# Patient Record
Sex: Female | Born: 1935 | Race: White | Hispanic: No
Health system: Southern US, Community
[De-identification: ages and names within clinical notes are randomized; demographics above are authoritative.]

## PROBLEM LIST (undated history)

## (undated) DIAGNOSIS — I10 Essential (primary) hypertension: Secondary | ICD-10-CM

---

## 2020-03-14 ENCOUNTER — Emergency Department (HOSPITAL_COMMUNITY): Payer: Medicare Other

## 2020-03-14 ENCOUNTER — Encounter (HOSPITAL_COMMUNITY): Payer: Self-pay

## 2020-03-14 ENCOUNTER — Other Ambulatory Visit: Payer: Self-pay

## 2020-03-14 ENCOUNTER — Emergency Department (HOSPITAL_COMMUNITY)
Admission: EM | Admit: 2020-03-14 | Discharge: 2020-03-14 | Disposition: A | Discharge status: Home / Self Care | Payer: Medicare Other | Attending: Emergency Medicine | Admitting: Emergency Medicine

## 2020-03-14 DIAGNOSIS — I10 Essential (primary) hypertension: Secondary | ICD-10-CM | POA: Diagnosis not present

## 2020-03-14 DIAGNOSIS — Y9311 Activity, swimming: Secondary | ICD-10-CM | POA: Diagnosis not present

## 2020-03-14 DIAGNOSIS — Y998 Other external cause status: Secondary | ICD-10-CM | POA: Diagnosis not present

## 2020-03-14 DIAGNOSIS — S0990XA Unspecified injury of head, initial encounter: Secondary | ICD-10-CM | POA: Diagnosis present

## 2020-03-14 DIAGNOSIS — S50812A Abrasion of left forearm, initial encounter: Secondary | ICD-10-CM | POA: Diagnosis not present

## 2020-03-14 DIAGNOSIS — M25531 Pain in right wrist: Secondary | ICD-10-CM | POA: Diagnosis not present

## 2020-03-14 DIAGNOSIS — S0181XA Laceration without foreign body of other part of head, initial encounter: Secondary | ICD-10-CM | POA: Diagnosis not present

## 2020-03-14 DIAGNOSIS — E010 Iodine-deficiency related diffuse (endemic) goiter: Secondary | ICD-10-CM

## 2020-03-14 DIAGNOSIS — S80212A Abrasion, left knee, initial encounter: Secondary | ICD-10-CM | POA: Insufficient documentation

## 2020-03-14 DIAGNOSIS — M25561 Pain in right knee: Secondary | ICD-10-CM | POA: Diagnosis not present

## 2020-03-14 DIAGNOSIS — M25532 Pain in left wrist: Secondary | ICD-10-CM | POA: Diagnosis not present

## 2020-03-14 DIAGNOSIS — W19XXXA Unspecified fall, initial encounter: Secondary | ICD-10-CM | POA: Diagnosis not present

## 2020-03-14 DIAGNOSIS — T07XXXA Unspecified multiple injuries, initial encounter: Secondary | ICD-10-CM

## 2020-03-14 DIAGNOSIS — Y929 Unspecified place or not applicable: Secondary | ICD-10-CM | POA: Diagnosis not present

## 2020-03-14 HISTORY — DX: Essential (primary) hypertension: I10

## 2020-03-14 MED ORDER — LIDOCAINE-EPINEPHRINE (PF) 2 %-1:200000 IJ SOLN
10.0000 mL | Freq: Once | INTRAMUSCULAR | Status: AC
  Administered 2020-03-14: 10 mL
  Filled 2020-03-14: qty 20

## 2020-03-14 MED ORDER — TETANUS-DIPHTH-ACELL PERTUSSIS 5-2.5-18.5 LF-MCG/0.5 IM SUSP: 0.5000 mL | Freq: Once | INTRAMUSCULAR | Status: DC

## 2020-03-14 NOTE — ED Triage Notes (Signed)
Pt reports her show caught on something and she tripped and fell. Pt now has hematoma to forehead. Pt denies blood thinners. Pt also endorses R shoulder, L wrist,  And R knee pain.

## 2020-03-14 NOTE — ED Provider Notes (Signed)
Ana Walsh Provider Note   CSN: [id number] Arrival date & time: 03/14/20  1142     History Chief Complaint  Patient presents with  . Fall    Ana Walsh is a 86 y.o. female.  HPI She presents for evaluation of injuries from fall.  She was walking in a unfamiliar area, around a swimming home when she tripped on a "paver."  She is able to get up and walk afterwards.  She presents complaining of pain in her forehead, bilateral wrist, right knee.  There was no loss of consciousness.  There has been no change in behavior since the fall.  She denies headache or neck pain.  She is visiting [place], from [place], [place].  She has known thyroid disease, and has had biopsies previously.  Tetanus booster last given in August 2020.  There are no other known modifying factors.    Past Medical History:  Diagnosis Date  . Hypertension     There are no problems to display for this patient.   History reviewed. No pertinent surgical history.   OB History   No obstetric history on file.     History reviewed. No pertinent family history.  Social History   Tobacco Use  . Smoking status: Not on file  Substance Use Topics  . Alcohol use: Not on file  . Drug use: Not on file    Home Medications Prior to Admission medications   Medication Sig Start Date End Date Taking? Authorizing Provider  amLODipine (NORVASC) 2.5 MG tablet Take 2.5 mg by mouth daily. 03/04/20   [provider]  diclofenac (VOLTAREN) 75 MG EC tablet Take 75 mg by mouth at bedtime. 01/09/20   [provider]  venlafaxine XR (EFFEXOR-XR) 37.5 MG 24 hr capsule Take 37.5 mg by mouth daily. 01/09/20   [provider]    Allergies    Dicyclomine, Tamoxifen, Codeine, Tramadol, Cephalexin, and Gabapentin  Review of Systems   Review of Systems  All other systems reviewed and are negative.   Physical Exam Updated Vital Signs BP (!) 148/75 (BP  Location: Right Arm)   Pulse 63   Temp 98 F (36.7 C) (Oral)   Resp 18   SpO2 97%   Physical Exam Vitals and nursing note reviewed.  Constitutional:      General: She is not in acute distress.    Appearance: She is well-developed. She is not ill-appearing.  HENT:     Head: Normocephalic.     Comments: Contusion mid forehead with laceration, moderate bleeding with inspection.  No associated crepitation.  Face is nontender to palpation.    Right Ear: External ear normal.     Left Ear: External ear normal.     Nose: No congestion or rhinorrhea.     Comments: Small contusion over bridge of nose.  No nasal deformity or septum defect.    Mouth/Throat:     Mouth: Mucous membranes are moist.     Pharynx: No oropharyngeal exudate or posterior oropharyngeal erythema.     Comments: No trismus Eyes:     Conjunctiva/sclera: Conjunctivae normal.     Pupils: Pupils are equal, round, and reactive to light.  Neck:     Trachea: Phonation normal.  Cardiovascular:     Rate and Rhythm: Normal rate and regular rhythm.     Heart sounds: Normal heart sounds.  Pulmonary:     Effort: Pulmonary effort is normal.     Breath sounds: Normal breath sounds.  Abdominal:     General: There is no distension.     Palpations: Abdomen is soft.  Musculoskeletal:        General: Normal range of motion.     Cervical back: Normal range of motion and neck supple.  Skin:    General: Skin is warm and dry.     Comments: Scattered minor abrasions, left knee, left forehead, left arm  Neurological:     Mental Status: She is alert and oriented to person, place, and time.     Cranial Nerves: No cranial nerve deficit.     Sensory: No sensory deficit.     Motor: No abnormal muscle tone.     Coordination: Coordination normal.  Psychiatric:        Mood and Affect: Mood normal.        Behavior: Behavior normal.        Thought Content: Thought content normal.        Judgment: Judgment normal.     ED Results /  Procedures / Treatments   Labs (all labs ordered are listed, but only abnormal results are displayed) Labs Reviewed - No data to display  EKG None  Radiology DG Shoulder Right  Result Date: 03/14/2020 CLINICAL DATA:  Right shoulder pain after fall. EXAM: RIGHT SHOULDER - 2+ VIEW COMPARISON:  None. FINDINGS: Exam demonstrates severe degenerative changes of the glenohumeral joint with mild degenerate change of the [facility] joint. No evidence of acute fracture or dislocation. Degenerative change of the spine. IMPRESSION: 1. No acute findings. 2. 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 Electronically Signed   By: Elberta Fortis M.D.   On: 03/14/2020 13:27   DG Wrist Complete Left  Result Date: 03/14/2020 CLINICAL DATA:  Left wrist pain post fall. EXAM: LEFT WRIST - COMPLETE 3+ VIEW COMPARISON:  None. FINDINGS: Degenerative change of the radiocarpal joint and radial side of the carpal bones including the first carpometacarpal joint. Diffuse decreased bone mineralization. Lateral film demonstrates suggestion of a minimally displaced fracture of the distal radial metaphysis. IMPRESSION: Suggestion of a minimally displaced distal radial metaphyseal fracture. Electronically Signed   By: Elberta Fortis M.D.   On: 03/14/2020 13:30   DG Knee 2 Views Right  Result Date: 03/14/2020 CLINICAL DATA:  Right knee pain post fall. EXAM: RIGHT KNEE - 1-2 VIEW COMPARISON:  None. FINDINGS: Diffuse decreased bone mineralization. Mild to moderate osteoarthritic change involving the medial compartment and patellofemoral joints. Small joint effusion. No evidence of acute fracture or dislocation. IMPRESSION: 1. No acute fracture. 2. Osteoarthritic change and small joint effusion. Electronically Signed   By: Elberta Fortis M.D.   On: 03/14/2020 13:30   CT Head Wo Contrast  Result Date: 03/14/2020 CLINICAL DATA:  Status post fall. Hit her forehead. Large hematoma in the forehead. EXAM: CT HEAD WITHOUT CONTRAST CT  CERVICAL SPINE WITHOUT CONTRAST TECHNIQUE: Multidetector CT imaging of the head and cervical spine was performed following the standard protocol without intravenous contrast. Multiplanar CT image reconstructions of the cervical spine were also generated. COMPARISON:  None. FINDINGS: CT HEAD FINDINGS Brain: No evidence of acute infarction, hemorrhage, extra-axial collection, ventriculomegaly, or mass effect. Generalized cerebral atrophy. Periventricular white matter low attenuation likely secondary to microangiopathy. Vascular: Cerebrovascular atherosclerotic calcifications are noted. Skull: Negative for fracture or focal lesion. Sinuses/Orbits: Visualized portions of the orbits are unremarkable. Chronic mucosal thickening of the right maxillary sinus. Visualized portions of the mastoid air cells are unremarkable. Other: Left frontal scalp hematoma. CT CERVICAL SPINE FINDINGS  Alignment: Normal. Skull base and vertebrae: No acute fracture. No primary bone lesion or focal pathologic process. Generalized osteopenia. Soft tissues and spinal canal: No prevertebral fluid or swelling. No visible canal hematoma. Disc levels: Degenerative disease with disc height loss at C3-4, C4-5 and C6-7. Anterior cervical disc fusion at C5-6. Bilateral uncovertebral degenerative changes at C3-4, C4-5, C5-6 and C6-7 with foraminal encroachment. Bilateral facet arthropathy of the cervical spine. Ankylosis of the posterior elements at C6-7. Upper chest: Lung apices are clear. Other: Severely enlarged thyroid gland partially visualized with coarse calcifications in the inferior right thyroid gland and left thyroid lobe. Subtle 12 mm left thyroid nodule. Leftward deviation of the trachea. Bilateral carotid artery atherosclerosis. IMPRESSION: 1. No acute intracranial pathology. 2. No acute osseous injury of the cervical spine. 3. Severely enlarged thyroid gland partially visualized with coarse calcifications in the inferior right thyroid gland  and left thyroid lobe. Subtle 12 mm left thyroid nodule. Recommend further evaluation with a thyroid ultrasound. If patient is clinically hyperthyroid, consider nuclear medicine thyroid uptake and scan. Recommend thyroid ultrasound (ref: J Am Coll Radiol. 2015 Feb;12(2): 143-50). Electronically Signed   By: Elige Ko   On: 03/14/2020 13:22   CT Cervical Spine Wo Contrast  Result Date: 03/14/2020 CLINICAL DATA:  Status post fall. Hit her forehead. Large hematoma in the forehead. EXAM: CT HEAD WITHOUT CONTRAST CT CERVICAL SPINE WITHOUT CONTRAST TECHNIQUE: Multidetector CT imaging of the head and cervical spine was performed following the standard protocol without intravenous contrast. Multiplanar CT image reconstructions of the cervical spine were also generated. COMPARISON:  None. FINDINGS: CT HEAD FINDINGS Brain: No evidence of acute infarction, hemorrhage, extra-axial collection, ventriculomegaly, or mass effect. Generalized cerebral atrophy. Periventricular white matter low attenuation likely secondary to microangiopathy. Vascular: Cerebrovascular atherosclerotic calcifications are noted. Skull: Negative for fracture or focal lesion. Sinuses/Orbits: Visualized portions of the orbits are unremarkable. Chronic mucosal thickening of the right maxillary sinus. Visualized portions of the mastoid air cells are unremarkable. Other: Left frontal scalp hematoma. CT CERVICAL SPINE FINDINGS Alignment: Normal. Skull base and vertebrae: No acute fracture. No primary bone lesion or focal pathologic process. Generalized osteopenia. Soft tissues and spinal canal: No prevertebral fluid or swelling. No visible canal hematoma. Disc levels: Degenerative disease with disc height loss at C3-4, C4-5 and C6-7. Anterior cervical disc fusion at C5-6. Bilateral uncovertebral degenerative changes at C3-4, C4-5, C5-6 and C6-7 with foraminal encroachment. Bilateral facet arthropathy of the cervical spine. Ankylosis of the posterior  elements at C6-7. Upper chest: Lung apices are clear. Other: Severely enlarged thyroid gland partially visualized with coarse calcifications in the inferior right thyroid gland and left thyroid lobe. Subtle 12 mm left thyroid nodule. Leftward deviation of the trachea. Bilateral carotid artery atherosclerosis. IMPRESSION: 1. No acute intracranial pathology. 2. No acute osseous injury of the cervical spine. 3. Severely enlarged thyroid gland partially visualized with coarse calcifications in the inferior right thyroid gland and left thyroid lobe. Subtle 12 mm left thyroid nodule. Recommend further evaluation with a thyroid ultrasound. If patient is clinically hyperthyroid, consider nuclear medicine thyroid uptake and scan. Recommend thyroid ultrasound (ref: J Am Coll Radiol. 2015 Feb;12(2): 143-50). Electronically Signed   By: Elige Ko   On: 03/14/2020 13:22    Procedures .Marland KitchenLaceration Repair  Date/Time: 03/14/2020 3:44 PM Performed by: Mancel Bale, MD Authorized by: Mancel Bale, MD   Consent:    Consent obtained:  Verbal   Consent given by:  Patient   Risks discussed:  Infection, poor cosmetic  result, poor wound healing and need for additional repair   Alternatives discussed:  No treatment Anesthesia (see MAR for exact dosages):    Anesthesia method:  Local infiltration   Local anesthetic:  Lidocaine 2% WITH epi Laceration details:    Location:  Face   Face location:  Forehead   Length (cm):  2   Depth (mm):  1.2 Repair type:    Repair type:  Simple Pre-procedure details:    Preparation:  Patient was prepped and draped in usual sterile fashion Exploration:    Hemostasis achieved with:  Direct pressure   Wound exploration: wound explored through full range of motion     Wound extent: no fascia violation noted, no foreign bodies/material noted, no muscle damage noted, no nerve damage noted and no vascular damage noted     Contaminated: no   Treatment:    Area cleansed with:   Betadine   Amount of cleaning:  Standard   Irrigation solution:  Sterile saline   Irrigation volume:  15 cc   Irrigation method:  Syringe   Visualized foreign bodies/material removed: no   Skin repair:    Repair method:  Sutures   Suture size:  5-0   Suture material:  Prolene   Suture technique:  Simple interrupted Approximation:    Approximation:  Loose Post-procedure details:    Dressing:  Antibiotic ointment and non-adherent dressing   Patient tolerance of procedure:  Tolerated well, no immediate complications   (including critical care time)  Medications Ordered in ED Medications  lidocaine-EPINEPHrine (XYLOCAINE W/EPI) 2 %-1:200000 (PF) injection 10 mL (10 mLs Infiltration Given 03/14/20 1530)    ED Course  I have reviewed the triage vital signs and the nursing notes.  Pertinent labs & imaging results that were available during my care of the patient were reviewed by me and considered in my medical decision making (see chart for details).    MDM Rules/Calculators/A&P                           Patient Vitals for the past 24 hrs:  BP Temp Temp src Pulse Resp SpO2  03/14/20 1600 (!) 148/75 -- -- 63 18 97 %  03/14/20 1501 (!) 175/100 98 F (36.7 C) Oral 67 18 97 %  03/14/20 1158 (!) 167/91 97.8 F (36.6 C) Oral 68 18 97 %    4:06 PM Reevaluation with update and discussion. After initial assessment and treatment, an updated evaluation reveals no change in status, 5 discussed with patient and daughter, all questions were answered. Mancel Bale   Medical Decision Making:  This patient is presenting for evaluation of injuries from fall, which does require a range of treatment options, and is a complaint that involves a high risk of morbidity and mortality. The differential diagnoses include intracranial injury, cervical fracture, extremity injury. I decided to review old records, and in summary elderly female, presenting with mechanical fall, without loss of  consciousness.  I obtained additional historical information from her daughter who witnessed the fall, and is in the emergency department.   Radiologic Tests Ordered, included CT head, CT cervical spine, radiographs of right knee, left wrist, right shoulder.  I independently Visualized: Radiographic images, which show normal except metaphyseal fracture distal left radius.  Seen primarily in the lateral view.  Nondisplaced.  Incidental thyromegaly seen on cervical spine imaging.  Patient and daughter are aware of this, previously.  Cardiac Monitor Tracing which shows normal sinus  rhythm   Critical Interventions-clinical evaluation, radiographic imaging, observation, laceration repair, reassessment  After These Interventions, the Patient was reevaluated and was found stable for discharge.  Mechanical fall with injuries.  No intracranial injury or cervical spine injury.  Doubt right knee fracture.  Left wrist injury is primarily contusion/strain, with possible distal radius metaphyseal fracture but there is also moderate arthritis more in the radiocarpal joint medially.  This possible fracture does not require surgical intervention at this time he can be followed expectantly with splinting.  Patient has good range of motion of the left wrist so we will place her in a removable Velcro splint.  She has minimal pain will not require narcotic treatment.  Her daughter specifies that the patient does not do well with oral narcotics.  CRITICAL CARE-no Performed by: Mancel BaleElliott Zalia Hautala  Nursing Notes Reviewed/ Care Coordinated Applicable Imaging Reviewed Interpretation of Laboratory Data incorporated into ED treatment  The patient appears reasonably screened and/or stabilized for discharge and I doubt any other medical condition or other [facility] requiring further screening, evaluation, or treatment in the ED at this time prior to discharge.  Plan: Home Medications-continue usual and use Tylenol for pain; Home  Treatments-cryotherapy for contusions, wound care discussed with patient and daughter, wrist splint as needed; patient can removed thyroid; return here if the recommended treatment, does not improve the symptoms; Recommended follow up-recommend follow-up suture removal 6 to 7 days, follow-up with thyroid doctor regarding thyromegaly seen on CT imaging, orthopedic follow-up for left wrist injury 1 to 2 weeks     Final Clinical Impression(s) / ED Diagnoses Final diagnoses:  Fall, initial encounter  Injury of head, initial encounter  Thyromegaly  Multiple contusions  Facial laceration, initial encounter  Multiple abrasions    Rx / DC Orders ED Discharge Orders    None       Mancel BaleWentz, Renne Platts, MD 03/14/20 1609

## 2020-03-14 NOTE — Discharge Instructions (Addendum)
There were no serious injuries from the fall.  For the wound on your forehead clean it well with soap and water once or twice a day and apply an antibiotic ointment such as Neosporin or bacitracin.  He may bleed and nose for a few days so he will probably need to cover it with a bandage of some sort.  Use ice on sore areas 3-4 times a day for 2 days.  For the abrasions that you have clean them well with soap and water daily and apply bacitracin.  Use the left wrist splint as needed for comfort.  You may have a fractured distal radius, and should see an orthopedist for a checkup in 1 to 2 weeks.  If you are not comfortable in the brace and can use your wrist and left hand without pain, you do not need to wear the brace.  For pain, use Tylenol every 4 hours.  Return here, if needed, for problems.

## 2021-02-12 ENCOUNTER — Other Ambulatory Visit: Payer: Self-pay | Admitting: Gastroenterology

## 2021-02-12 DIAGNOSIS — R634 Abnormal weight loss: Secondary | ICD-10-CM

## 2021-02-12 DIAGNOSIS — R1030 Lower abdominal pain, unspecified: Secondary | ICD-10-CM

## 2021-02-22 ENCOUNTER — Other Ambulatory Visit: Payer: Self-pay

## 2021-02-22 ENCOUNTER — Ambulatory Visit
Admission: RE | Admit: 2021-02-22 | Discharge: 2021-02-22 | Disposition: A | Discharge status: Home / Self Care | Payer: Medicare Other | Source: Ambulatory Visit | Attending: Gastroenterology | Admitting: Gastroenterology

## 2021-02-22 DIAGNOSIS — R1030 Lower abdominal pain, unspecified: Secondary | ICD-10-CM

## 2021-02-22 DIAGNOSIS — R634 Abnormal weight loss: Secondary | ICD-10-CM

## 2021-02-22 MED ORDER — IOPAMIDOL (ISOVUE-300) INJECTION 61%
100.0000 mL | Freq: Once | INTRAVENOUS | Status: AC | PRN
  Administered 2021-02-22: 100 mL via INTRAVENOUS

## 2022-01-11 ENCOUNTER — Other Ambulatory Visit: Payer: Self-pay

## 2022-01-11 ENCOUNTER — Emergency Department (HOSPITAL_COMMUNITY)
Admission: EM | Admit: 2022-01-11 | Discharge: 2022-01-12 | Disposition: A | Discharge status: Home / Self Care | Payer: Medicare Other | Attending: Emergency Medicine | Admitting: Emergency Medicine

## 2022-01-11 ENCOUNTER — Emergency Department (HOSPITAL_COMMUNITY): Payer: Medicare Other

## 2022-01-11 DIAGNOSIS — R404 Transient alteration of awareness: Secondary | ICD-10-CM | POA: Insufficient documentation

## 2022-01-11 DIAGNOSIS — R4182 Altered mental status, unspecified: Secondary | ICD-10-CM | POA: Diagnosis present

## 2022-01-11 LAB — COMPREHENSIVE METABOLIC PANEL
ALT: 17 U/L (ref 0–44)
AST: 14 U/L — ABNORMAL LOW (ref 15–41)
Albumin: 3.5 g/dL (ref 3.5–5.0)
Alkaline Phosphatase: 55 U/L (ref 38–126)
Anion gap: 9 (ref 5–15)
BUN: 17 mg/dL (ref 8–23)
CO2: 26 mmol/L (ref 22–32)
Calcium: 8.8 mg/dL — ABNORMAL LOW (ref 8.9–10.3)
Chloride: 103 mmol/L (ref 98–111)
Creatinine, Ser: 0.89 mg/dL (ref 0.44–1.00)
GFR, Estimated: >60 mL/min (ref >60)
Glucose, Bld: 106 mg/dL — ABNORMAL HIGH (ref 70–99)
Potassium: 3.4 mmol/L — ABNORMAL LOW (ref 3.5–5.1)
Sodium: 138 mmol/L (ref 135–145)
Total Bilirubin: 1.1 mg/dL (ref 0.3–1.2)
Total Protein: 6.4 g/dL — ABNORMAL LOW (ref 6.5–8.1)

## 2022-01-11 LAB — CBC
HCT: 46.4 % — ABNORMAL HIGH (ref 36.0–46.0)
Hemoglobin: 15 g/dL (ref 12.0–15.0)
MCH: 31.2 pg (ref 26.0–34.0)
MCHC: 32.3 g/dL (ref 30.0–36.0)
MCV: 96.5 fL (ref 80.0–100.0)
Platelets: 196 10*3/uL (ref 150–400)
RBC: 4.81 MIL/uL (ref 3.87–5.11)
RDW: 14.1 % (ref 11.5–15.5)
WBC: 10.1 10*3/uL (ref 4.0–10.5)
nRBC: 0 % (ref 0.0–0.2)

## 2022-01-11 LAB — CBG MONITORING, ED: Glucose-Capillary: 118 mg/dL — ABNORMAL HIGH (ref 70–99)

## 2022-01-11 MED ORDER — LORAZEPAM 2 MG/ML IJ SOLN
1.0000 mg | INTRAMUSCULAR | Status: AC | PRN
  Administered 2022-01-12: 1 mg via INTRAVENOUS
  Filled 2022-01-11: qty 1

## 2022-01-11 NOTE — ED Provider Notes (Signed)
[facility] EMERGENCY DEPARTMENT Provider Note   CSN: [id number] Arrival date & time: 01/11/22  1944     History {Add pertinent medical, surgical, social history, OB history to HPI:1} Chief Complaint  Patient presents with   Altered Mental Status    Ana Walsh is a 88 y.o. female.  Presenting to ER due to concern for speech difficulty, confusion.  Per daughter she has had decline in her overall cognitive function over the past 10 days or so.  Has been noting some mild changes in her speech, compared to yesterday afternoon however her speech has significantly worsened.  Now seems more confused and having increased word finding difficulty.  Daughter reports that she has been diagnosed previously with dementia.  Resides at an assisted living facility per daughter.  When asked directly patient denies any acute medical complaints but she struggles to answer any complex questioning.  HPI     Home Medications Prior to Admission medications   Medication Sig Start Date End Date Taking? Authorizing Provider  amLODipine (NORVASC) 2.5 MG tablet Take 2.5 mg by mouth daily. 03/04/20   [provider]  diclofenac (VOLTAREN) 75 MG EC tablet Take 75 mg by mouth at bedtime. 01/09/20   [provider]  venlafaxine XR (EFFEXOR-XR) 37.5 MG 24 hr capsule Take 37.5 mg by mouth daily. 01/09/20   [provider]      Allergies    Dicyclomine, Tamoxifen, Codeine, Tramadol, Cephalexin, and Gabapentin    Review of Systems   Review of Systems  Unable to perform ROS: Dementia    Physical Exam Updated Vital Signs BP 139/65   Pulse 71   Temp 98.6 F (37 C) (Oral)   Resp 18   Ht 5\' 5"  (1.651 m)   Wt 77.1 kg   SpO2 91%   BMI 28.29 kg/m  Physical Exam Vitals and nursing note reviewed.  Constitutional:      General: She is not in acute distress.    Appearance: She is well-developed.  HENT:     Head: Normocephalic and atraumatic.  Eyes:      Conjunctiva/sclera: Conjunctivae normal.  Cardiovascular:     Rate and Rhythm: Normal rate and regular rhythm.     Heart sounds: No murmur heard. Pulmonary:     Effort: Pulmonary effort is normal. No respiratory distress.     Breath sounds: Normal breath sounds.  Abdominal:     Palpations: Abdomen is soft.     Tenderness: There is no abdominal tenderness.  Musculoskeletal:        General: No swelling.     Cervical back: Neck supple.  Skin:    General: Skin is warm and dry.     Capillary Refill: Capillary refill takes less than 2 seconds.  Neurological:     Mental Status: She is alert.     Comments: Alert, oriented to person but not time or place CN 2-12 intact Patient answering basic questions appropriately but does have some word finding difficulty, speech itself is clear, no frank dysarthria 5/5 strength in b/l UE and LE Sensation to light touch intact in b/l UE and LE Normal FNF  Psychiatric:        Mood and Affect: Mood normal.     ED Results / Procedures / Treatments   Labs (all labs ordered are listed, but only abnormal results are displayed) Labs Reviewed  COMPREHENSIVE METABOLIC PANEL - Abnormal; Notable for the following components:      Result Value  Potassium 3.4 (*)    Glucose, Bld 106 (*)    Calcium 8.8 (*)    Total Protein 6.4 (*)    AST 14 (*)    All other components within normal limits  CBC - Abnormal; Notable for the following components:   HCT 46.4 (*)    All other components within normal limits  CBG MONITORING, ED - Abnormal; Notable for the following components:   Glucose-Capillary 118 (*)    All other components within normal limits  URINALYSIS, ROUTINE W REFLEX MICROSCOPIC    EKG EKG Interpretation  Date/Time:  Tuesday January 11 2022 19:46:42 EDT Ventricular Rate:  69 PR Interval:  130 QRS Duration: 145 QT Interval:  426 QTC Calculation: 457 R Axis:   196 Text Interpretation: Sinus arrhythmia Right bundle branch block Borderline ST  depression, lateral leads Confirmed by Marianna Fuss ([id number]) on 01/11/2022 9:12:39 PM  Radiology No results found.  Procedures Procedures  {Document cardiac monitor, telemetry assessment procedure when appropriate:1}  Medications Ordered in ED Medications - No data to display  ED Course/ Medical Decision Making/ A&P                           Medical Decision Making Amount and/or Complexity of Data Reviewed Labs: ordered. Radiology: ordered.   88 year old lady presenting to ER due to concern for increased confusion and speech difficulty.  Has history of dementia.  Daughter had noted some changes over the past 10 days but things seem to be significantly worse over the past 24 hours.  Patient does appear to have some word finding difficulty and some confusion.  I discussed the case with Dr. Otelia Limes on-call for neurology and and he advises going straight to MRI to evaluate for possibility of stroke.  We will check basic lab work, will check urinalysis for possible UTI.  {Document critical care time when appropriate:1} {Document review of labs and clinical decision tools ie heart score, Chads2Vasc2 etc:1}  {Document your independent review of radiology images, and any outside records:1} {Document your discussion with family members, caretakers, and with consultants:1} {Document social determinants of health affecting pt's care:1} {Document your decision making why or why not admission, treatments were needed:1} Final Clinical Impression(s) / ED Diagnoses Final diagnoses:  None    Rx / DC Orders ED Discharge Orders     None

## 2022-01-11 NOTE — ED Notes (Signed)
Patient transported to x-ray. ?

## 2022-01-11 NOTE — ED Triage Notes (Addendum)
Pt via EMS from [facility]. Hx dementia with recent altered mental status per facility staff and family who note that she has been slower to respond and follow commands. LKN unclear per report. Facility DNR form present on arrival.   Pt denies pain and has no physical complaints. VS WNL en route with CBG 115, BP 137/69, HR 75, O2 92% on room air increased to 95% on 2L. No baseline O2 requirement. RBBB noted on EKG, 20ga IV in left AC inserted en route. Alert and oriented to self, ambulatory with standby assist at baseline. Family at bedside (daughter/HCPOA) states pt was at her normal yesterday at 4pm.

## 2022-01-12 ENCOUNTER — Emergency Department (HOSPITAL_COMMUNITY): Payer: Medicare Other

## 2022-01-12 DIAGNOSIS — R404 Transient alteration of awareness: Secondary | ICD-10-CM | POA: Diagnosis not present

## 2022-01-12 LAB — URINALYSIS, ROUTINE W REFLEX MICROSCOPIC
Bilirubin Urine: NEGATIVE
Glucose, UA: NEGATIVE mg/dL
Hgb urine dipstick: NEGATIVE
Ketones, ur: NEGATIVE mg/dL
Leukocytes,Ua: NEGATIVE
Nitrite: NEGATIVE
Protein, ur: NEGATIVE mg/dL
Specific Gravity, Urine: 1.023 (ref 1.005–1.030)
pH: 5 (ref 5.0–8.0)

## 2022-01-12 NOTE — ED Provider Notes (Signed)
I assumed care at signout to follow-up on MRI and urinalysis. Work-up in the emergency department is unremarkable. Daughter feels comfortable taking patient back to her assisted living, but she is now somnolent from the Ativan.  When she is more alert she will be discharged in the daughter's care    Zadie Rhine, MD 01/12/22 [phone]

## 2022-01-12 NOTE — Discharge Instructions (Signed)

## 2022-01-29 ENCOUNTER — Other Ambulatory Visit: Payer: Self-pay

## 2022-01-29 ENCOUNTER — Emergency Department (HOSPITAL_COMMUNITY): Payer: Medicare Other

## 2022-01-29 ENCOUNTER — Encounter (HOSPITAL_COMMUNITY): Payer: Self-pay

## 2022-01-29 ENCOUNTER — Inpatient Hospital Stay (HOSPITAL_COMMUNITY)
Admission: EM | Admit: 2022-01-29 | Discharge: 2022-02-03 | DRG: 689 | Disposition: A | Discharge status: Skilled Nursing Facility | Payer: Medicare Other | Source: Skilled Nursing Facility | Attending: Internal Medicine | Admitting: Internal Medicine

## 2022-01-29 DIAGNOSIS — N390 Urinary tract infection, site not specified: Secondary | ICD-10-CM | POA: Diagnosis not present

## 2022-01-29 DIAGNOSIS — R296 Repeated falls: Secondary | ICD-10-CM | POA: Diagnosis present

## 2022-01-29 DIAGNOSIS — Z888 Allergy status to other drugs, medicaments and biological substances status: Secondary | ICD-10-CM

## 2022-01-29 DIAGNOSIS — E876 Hypokalemia: Secondary | ICD-10-CM | POA: Diagnosis present

## 2022-01-29 DIAGNOSIS — J9601 Acute respiratory failure with hypoxia: Secondary | ICD-10-CM | POA: Diagnosis not present

## 2022-01-29 DIAGNOSIS — I10 Essential (primary) hypertension: Secondary | ICD-10-CM | POA: Diagnosis present

## 2022-01-29 DIAGNOSIS — I2699 Other pulmonary embolism without acute cor pulmonale: Secondary | ICD-10-CM | POA: Diagnosis present

## 2022-01-29 DIAGNOSIS — Z66 Do not resuscitate: Secondary | ICD-10-CM | POA: Diagnosis present

## 2022-01-29 DIAGNOSIS — Z880 Allergy status to penicillin: Secondary | ICD-10-CM

## 2022-01-29 DIAGNOSIS — R531 Weakness: Secondary | ICD-10-CM

## 2022-01-29 DIAGNOSIS — F0390 Unspecified dementia without behavioral disturbance: Secondary | ICD-10-CM | POA: Diagnosis present

## 2022-01-29 DIAGNOSIS — Z87891 Personal history of nicotine dependence: Secondary | ICD-10-CM

## 2022-01-29 DIAGNOSIS — R443 Hallucinations, unspecified: Secondary | ICD-10-CM

## 2022-01-29 DIAGNOSIS — E041 Nontoxic single thyroid nodule: Secondary | ICD-10-CM | POA: Diagnosis present

## 2022-01-29 DIAGNOSIS — G9341 Metabolic encephalopathy: Secondary | ICD-10-CM | POA: Diagnosis present

## 2022-01-29 DIAGNOSIS — F0392 Unspecified dementia, unspecified severity, with psychotic disturbance: Secondary | ICD-10-CM | POA: Diagnosis present

## 2022-01-29 DIAGNOSIS — Z7952 Long term (current) use of systemic steroids: Secondary | ICD-10-CM

## 2022-01-29 DIAGNOSIS — Z20822 Contact with and (suspected) exposure to covid-19: Secondary | ICD-10-CM | POA: Diagnosis present

## 2022-01-29 DIAGNOSIS — Z79899 Other long term (current) drug therapy: Secondary | ICD-10-CM

## 2022-01-29 DIAGNOSIS — F0394 Unspecified dementia, unspecified severity, with anxiety: Secondary | ICD-10-CM | POA: Diagnosis present

## 2022-01-29 DIAGNOSIS — F0393 Unspecified dementia, unspecified severity, with mood disturbance: Secondary | ICD-10-CM | POA: Diagnosis present

## 2022-01-29 DIAGNOSIS — Z885 Allergy status to narcotic agent status: Secondary | ICD-10-CM

## 2022-01-29 DIAGNOSIS — B9620 Unspecified Escherichia coli [E. coli] as the cause of diseases classified elsewhere: Secondary | ICD-10-CM | POA: Diagnosis present

## 2022-01-29 DIAGNOSIS — I959 Hypotension, unspecified: Secondary | ICD-10-CM | POA: Diagnosis present

## 2022-01-29 DIAGNOSIS — R627 Adult failure to thrive: Secondary | ICD-10-CM | POA: Diagnosis present

## 2022-01-29 LAB — URINALYSIS, ROUTINE W REFLEX MICROSCOPIC
Bilirubin Urine: NEGATIVE
Glucose, UA: NEGATIVE mg/dL
Ketones, ur: NEGATIVE mg/dL
Nitrite: POSITIVE — AB
Protein, ur: NEGATIVE mg/dL
Specific Gravity, Urine: 1.019 (ref 1.005–1.030)
pH: 5 (ref 5.0–8.0)

## 2022-01-29 LAB — CBC WITH DIFFERENTIAL/PLATELET
Abs Immature Granulocytes: 0.08 10*3/uL — ABNORMAL HIGH (ref 0.00–0.07)
Basophils Absolute: 0 10*3/uL (ref 0.0–0.1)
Basophils Relative: 0 %
Eosinophils Absolute: 0 10*3/uL (ref 0.0–0.5)
Eosinophils Relative: 0 %
HCT: 48.7 % — ABNORMAL HIGH (ref 36.0–46.0)
Hemoglobin: 16.3 g/dL — ABNORMAL HIGH (ref 12.0–15.0)
Immature Granulocytes: 1 %
Lymphocytes Relative: 6 %
Lymphs Abs: 0.7 10*3/uL (ref 0.7–4.0)
MCH: 32.2 pg (ref 26.0–34.0)
MCHC: 33.5 g/dL (ref 30.0–36.0)
MCV: 96.2 fL (ref 80.0–100.0)
Monocytes Absolute: 0.6 10*3/uL (ref 0.1–1.0)
Monocytes Relative: 5 %
Neutro Abs: 10.2 10*3/uL — ABNORMAL HIGH (ref 1.7–7.7)
Neutrophils Relative %: 88 %
Platelets: 199 10*3/uL (ref 150–400)
RBC: 5.06 MIL/uL (ref 3.87–5.11)
RDW: 13.6 % (ref 11.5–15.5)
WBC: 11.7 10*3/uL — ABNORMAL HIGH (ref 4.0–10.5)
nRBC: 0 % (ref 0.0–0.2)

## 2022-01-29 LAB — TROPONIN I (HIGH SENSITIVITY)
Troponin I (High Sensitivity): 8 ng/L (ref <18)
Troponin I (High Sensitivity): 7 ng/L

## 2022-01-29 LAB — RESP PANEL BY RT-PCR (RSV, FLU A&B, COVID)  RVPGX2
Influenza A by PCR: NEGATIVE
Influenza B by PCR: NEGATIVE
Resp Syncytial Virus by PCR: NEGATIVE
SARS Coronavirus 2 by RT PCR: NEGATIVE

## 2022-01-29 LAB — COMPREHENSIVE METABOLIC PANEL
ALT: 23 U/L (ref 0–44)
AST: 29 U/L (ref 15–41)
Albumin: 3.8 g/dL (ref 3.5–5.0)
Alkaline Phosphatase: 62 U/L (ref 38–126)
Anion gap: 10 (ref 5–15)
BUN: 16 mg/dL (ref 8–23)
CO2: 27 mmol/L (ref 22–32)
Calcium: 9.5 mg/dL (ref 8.9–10.3)
Chloride: 104 mmol/L (ref 98–111)
Creatinine, Ser: 0.74 mg/dL (ref 0.44–1.00)
GFR, Estimated: >60 mL/min (ref >60)
Glucose, Bld: 107 mg/dL — ABNORMAL HIGH (ref 70–99)
Potassium: 3.1 mmol/L — ABNORMAL LOW (ref 3.5–5.1)
Sodium: 141 mmol/L (ref 135–145)
Total Bilirubin: 1.6 mg/dL — ABNORMAL HIGH (ref 0.3–1.2)
Total Protein: 6.8 g/dL (ref 6.5–8.1)

## 2022-01-29 LAB — BRAIN NATRIURETIC PEPTIDE: B Natriuretic Peptide: 63.5 pg/mL (ref 0.0–100.0)

## 2022-01-29 LAB — CBG MONITORING, ED: Glucose-Capillary: 103 mg/dL — ABNORMAL HIGH (ref 70–99)

## 2022-01-29 MED ORDER — RIVASTIGMINE 4.6 MG/24HR TD PT24
4.6000 mg | MEDICATED_PATCH | Freq: Every day | TRANSDERMAL | Status: DC
  Administered 2022-01-30 – 2022-02-03 (×5): 4.6 mg via TRANSDERMAL
  Filled 2022-01-29 (×5): qty 1

## 2022-01-29 MED ORDER — SERTRALINE HCL 100 MG PO TABS: 100.0000 mg | ORAL_TABLET | Freq: Every day | ORAL | Status: DC

## 2022-01-29 MED ORDER — ACETAMINOPHEN 500 MG PO TABS
500.0000 mg | ORAL_TABLET | Freq: Two times a day (BID) | ORAL | Status: DC
  Administered 2022-01-29 – 2022-02-03 (×10): 500 mg via ORAL
  Filled 2022-01-29 (×10): qty 1

## 2022-01-29 MED ORDER — ENOXAPARIN SODIUM 80 MG/0.8ML IJ SOSY
70.0000 mg | PREFILLED_SYRINGE | Freq: Two times a day (BID) | INTRAMUSCULAR | Status: DC
  Administered 2022-01-29 – 2022-02-01 (×7): 70 mg via SUBCUTANEOUS
  Filled 2022-01-29 (×7): qty 0.8

## 2022-01-29 MED ORDER — ACETAMINOPHEN 325 MG PO TABS: 650.0000 mg | ORAL_TABLET | Freq: Four times a day (QID) | ORAL | Status: DC | PRN

## 2022-01-29 MED ORDER — IOHEXOL 350 MG/ML SOLN
75.0000 mL | Freq: Once | INTRAVENOUS | Status: AC | PRN
  Administered 2022-01-29: 75 mL via INTRAVENOUS

## 2022-01-29 MED ORDER — ENOXAPARIN SODIUM 40 MG/0.4ML IJ SOSY: 40.0000 mg | PREFILLED_SYRINGE | INTRAMUSCULAR | Status: DC

## 2022-01-29 MED ORDER — POTASSIUM CHLORIDE CRYS ER 20 MEQ PO TBCR
30.0000 meq | EXTENDED_RELEASE_TABLET | ORAL | Status: AC
  Administered 2022-01-29 (×2): 30 meq via ORAL
  Filled 2022-01-29 (×2): qty 1

## 2022-01-29 MED ORDER — ACETAMINOPHEN 650 MG RE SUPP: 650.0000 mg | Freq: Four times a day (QID) | RECTAL | Status: DC | PRN

## 2022-01-29 MED ORDER — SERTRALINE HCL 25 MG PO TABS
125.0000 mg | ORAL_TABLET | Freq: Every day | ORAL | Status: DC
  Administered 2022-01-30 – 2022-02-03 (×5): 125 mg via ORAL
  Filled 2022-01-29 (×5): qty 1

## 2022-01-29 MED ORDER — RISPERIDONE 1 MG PO TABS
2.0000 mg | ORAL_TABLET | Freq: Two times a day (BID) | ORAL | Status: DC
  Administered 2022-01-29 – 2022-02-03 (×10): 2 mg via ORAL
  Filled 2022-01-29 (×10): qty 2

## 2022-01-29 NOTE — H&P (Signed)
History and Physical    Ana Walsh WUJ:[id number] DOB: [birth year] DOA: 01/29/2022  PCP: System, Provider Not In  Patient coming from: [facility] Chief Complaint: reduced function, hypoxic, falls  HPI: Ana Walsh is a 88 y.o. female with a pertinent history of dementia who presents to the emergency department with generalized weakness, increased hallucination and reduced appetite and has had multiple falls.  Daughter provides a lot of the history.  States that especially for the last 2 weeks she has been weak and difficulty getting out of the chair and using walker has been hunched over.  And when nursing staff noted they checked her saturations to be in the 70's and 80's which improved with some oxygen.  At baseline she has been having hallucinations, non-bothersome which have been helped by neurology and medicines.  They think the last 2 weeks she has probably deconditioned some too and wonder if she should escalate to memory care but HCPOA has had some reassurance from [facility] provider.  Recent ED visit she had a catatonic spell where she was staring down and not responding to questions and MRI of the brain was negative.  At baseline able to use her walker.  Zoloft was increased about 3 weeks ago.    mental status was better, uses a walker in memory unit.    In the ED, 159/84, 98.4 temp, 68 heart rate, 92% to 96% on 2 L of oxygen, RR 16, WBC to 11.7, Hgb 16.3, PLT 199, NA 141, K3.1, CO2 27, calcium 9.5, glucose 107, troponin 7.  Chest x-ray showed no evidence of active cardiopulmonary process.  CT angio CT PE is being done which I was called to say there was a little PE there without any infiltrate or other acute findings.   Review of Systems: As per HPI otherwise 10 point review of systems negative.  Other pertinents as below:  General - unable to perform full ROS with pt's mental status HEENT -  Cardio - denies any cp or palpitations currently Resp - does have some sob GI -  denies any heamtochezia or melena GU -  MSK -  Skin -  Neuro -  Psych - has hallucinations   Past Medical History:  Diagnosis Date   Hypertension     History reviewed. No pertinent surgical history.   has no history on file for tobacco use, alcohol use, and drug use.  Allergies  Allergen Reactions   Dicyclomine Anaphylaxis and Swelling    Swelling of tongue   Tamoxifen Anaphylaxis   Codeine Nausea And Vomiting   Doxycycline     unk   Penicillins     unk   Tramadol Other (See Comments)    Drowsiness   Cephalexin Diarrhea   Gabapentin Other (See Comments)    confusion    No family history on file.   Prior to Admission medications   Medication Sig Start Date End Date Taking? Authorizing Provider  acetaminophen (TYLENOL) 500 MG tablet Take 500 mg by mouth in the morning and at bedtime.   Yes [provider]  Calcium Carbonate-Vit D-Min (CALCIUM 600+D PLUS MINERALS) 600-400 MG-UNIT TABS Take 1 tablet by mouth in the morning and at bedtime.   Yes [provider]  loperamide (IMODIUM) 2 MG capsule Take 2 mg by mouth daily.   Yes [provider]  predniSONE (DELTASONE) 10 MG tablet Take 10 mg by mouth daily with breakfast.   Yes [provider]  Probiotic Product (ALIGN) 4 MG CAPS Take  4 mg by mouth daily.   Yes [provider]  psyllium (REGULOID) 0.52 g capsule Take 0.52 g by mouth in the morning and at bedtime.   Yes [provider]  risperiDONE (RISPERDAL) 2 MG tablet Take 2 mg by mouth 2 (two) times daily. 01/19/22  Yes [provider]  risperiDONE (RISPERDAL) 0.5 MG tablet Take 0.5 mg by mouth 2 (two) times daily. Take along with 1 mg tab to equal 1.5 mg bid    [provider]  risperiDONE (RISPERDAL) 1 MG tablet Take 1 mg by mouth 2 (two) times daily. Take along with 0.5 mg to equal 1.5 mg bid    [provider]  rivastigmine (EXELON) 4.6 mg/24hr Place 4.6 mg onto the skin daily.     [provider]  sertraline (ZOLOFT) 100 MG tablet Take 100 mg by mouth daily. Take along with 25 mg tab to equal 125 mg    [provider]  sertraline (ZOLOFT) 25 MG tablet Take 25 mg by mouth daily. Take along with 100 mg tab to equal 125 mg    [provider]    Physical Exam: Vitals:   01/29/22 1544 01/29/22 1704 01/29/22 1739 01/29/22 1832  BP:  118/68 (!) 142/69   Pulse: 70 68 73   Resp: 20 18 18    Temp:  98.3 F (36.8 C) 97.8 F (36.6 C)   TempSrc:   Oral   SpO2: 95% 94% 97%   Weight:    66.7 kg  Height:        Constitutional: NAD, comfortable Eyes: pupils equal and reactive to light, anicteric, without injection ENMT: MMM, throat without exudates or erythema Neck: normal, supple, no masses, no thyromegaly noted Respiratory: CTAB, nwob  Cardiovascular: rrr w/o mrg, warm extremities Abdomen: NBS, NT,   Musculoskeletal: moving all 4 extremities, strength grossly intact 5/5 in the UE and LE's Skin: no rashes, lesions, ulcers. No induration Neurologic: CN 2-12 grossly intact. Sensation intact Psychiatric: AO appearing, mentation appropriate  Labs on Admission: I have personally reviewed following labs and imaging studies  CBC: Recent Labs  Lab 01/29/22 1221  WBC 11.7*  NEUTROABS 10.2*  HGB 16.3*  HCT 48.7*  MCV 96.2  PLT 199   Basic Metabolic Panel: Recent Labs  Lab 01/29/22 1221  NA 141  K 3.1*  CL 104  CO2 27  GLUCOSE 107*  BUN 16  CREATININE 0.74  CALCIUM 9.5   GFR: Estimated Creatinine Clearance: 45.4 mL/min (by C-G formula based on SCr of 0.74 mg/dL). Liver Function Tests: Recent Labs  Lab 01/29/22 1221  AST 29  ALT 23  ALKPHOS 62  BILITOT 1.6*  PROT 6.8  ALBUMIN 3.8   No results for input(s): "LIPASE", "AMYLASE" in the last 168 hours. No results for input(s): "AMMONIA" in the last 168 hours. Coagulation Profile: No results for input(s): "INR", "PROTIME" in the last 168 hours. Cardiac Enzymes: No results  for input(s): "CKTOTAL", "CKMB", "CKMBINDEX", "TROPONINI" in the last 168 hours. BNP (last 3 results) No results for input(s): "PROBNP" in the last 8760 hours. HbA1C: No results for input(s): "HGBA1C" in the last 72 hours. CBG: Recent Labs  Lab 01/29/22 1053  GLUCAP 103*   Lipid Profile: No results for input(s): "CHOL", "HDL", "LDLCALC", "TRIG", "CHOLHDL", "LDLDIRECT" in the last 72 hours. Thyroid Function Tests: No results for input(s): "TSH", "T4TOTAL", "FREET4", "T3FREE", "THYROIDAB" in the last 72 hours. Anemia Panel: No results for input(s): "VITAMINB12", "FOLATE", "FERRITIN", "TIBC", "IRON", "RETICCTPCT" in the last  72 hours. Urine analysis:    Component Value Date/Time   COLORURINE YELLOW 01/29/2022 1500   APPEARANCEUR HAZY (A) 01/29/2022 1500   LABSPEC 1.019 01/29/2022 1500   PHURINE 5.0 01/29/2022 1500   GLUCOSEU NEGATIVE 01/29/2022 1500   HGBUR SMALL (A) 01/29/2022 1500   BILIRUBINUR NEGATIVE 01/29/2022 1500   KETONESUR NEGATIVE 01/29/2022 1500   PROTEINUR NEGATIVE 01/29/2022 1500   NITRITE POSITIVE (A) 01/29/2022 1500   LEUKOCYTESUR MODERATE (A) 01/29/2022 1500    Radiological Exams on Admission: CT Angio Chest PE W and/or Wo Contrast  Result Date: 01/29/2022 CLINICAL DATA:  High clinical suspicion of pulmonary embolism, weakness, hallucinations EXAM: CT ANGIOGRAPHY CHEST WITH CONTRAST TECHNIQUE: Multidetector CT imaging of the chest was performed using the standard protocol during bolus administration of intravenous contrast. Multiplanar CT image reconstructions and MIPs were obtained to evaluate the vascular anatomy. RADIATION DOSE REDUCTION: This exam was performed according to the departmental dose-optimization program which includes automated exposure control, adjustment of the mA and/or kV according to patient size and/or use of iterative reconstruction technique. CONTRAST:  43mL OMNIPAQUE IOHEXOL 350 MG/ML SOLN IV COMPARISON:  None available FINDINGS:  Cardiovascular: Atherosclerotic calcifications aorta without aneurysm or dissection. Heart unremarkable. No pericardial effusion. Pulmonary arteries adequately opacified. Small probable small filling defect identified within a LEFT lower lobe pulmonary artery branch, likely a small pulmonary embolus. No additional pulmonary emboli. Mediastinum/Nodes: Marked enlargement of RIGHT thyroid lobe with mass effect upon the RIGHT lateral aspect of the trachea, question goiter versus mass. Esophagus unremarkable. No adenopathy. Lungs/Pleura: Dependent atelectasis lower lobes. No pulmonary infiltrate, pleural effusion, or pneumothorax. Upper Abdomen: Visualized upper abdomen unremarkable Musculoskeletal: Osseous structures demineralized with scattered degenerative changes thoracic spine. Review of the MIP images confirms the above findings. IMPRESSION: Single probable small pulmonary embolus in a LEFT lower lobe pulmonary arterial branch as above. Bibasilar atelectasis. Marked enlargement of RIGHT thyroid lobe with mass effect upon trachea, question mass versus goiter; follow-up thyroid ultrasound evaluation recommended. Aortic Atherosclerosis (ICD10-I70.0). Critical Value/emergent results were called by telephone at the time of interpretation on 01/29/2022 at 4:55 pm to provider [facility] , who verbally acknowledged these results. Electronically Signed   By: Ulyses Southward M.D.   On: 01/29/2022 16:55   DG Chest Port 1 View  Result Date: 01/29/2022 CLINICAL DATA:  Hypoxia. Generalized weakness with decreased appetite and hallucinations. EXAM: PORTABLE CHEST 1 VIEW COMPARISON:  Radiographs 01/11/2022. FINDINGS: 1228 hours. Improved aeration of the lung bases. The heart size and mediastinal contours are stable with mild aortic atherosclerosis. The lungs are clear. There is no pleural effusion or pneumothorax. Old rib fractures are noted bilaterally, and there are glenohumeral degenerative changes bilaterally. Postsurgical  changes are present lower cervical spine and left breast. Telemetry leads overlie the chest. IMPRESSION: No evidence of active cardiopulmonary process. Electronically Signed   By: Carey Bullocks M.D.   On: 01/29/2022 13:10    EKG: Independently reviewed.   Assessment/Plan Principal Problem:   Acute pulmonary embolism (HCC) Active Problems:   Failure to thrive in adult   Acute hypoxemic respiratory failure (HCC)   #hypoxic respiratory failure for last 2-3 weeks, assessing burden with LE's with dopplers which is yet to be done but after further thought and talking with Clide Deutscher the [facility], we agreed to go ahead reasonably with anticaogulation despite fall risk in hopes that it will improve oxygen. --Perhaps the hypoxia relates to her weakness and fatigue recently so hopeful that it will improve and patient can return to her  function.   --No signs of infection --oxygen, wean as able.  #hypokalemia - replaced, monitor in am  #Mood- maybe worsening of mental status with increase of sertraline to 125mg  consider decreasin to 100  #Dementia, hallucinations - continue requip 2mg  BID --follows with neurology in the outpatient  #unsteadiness, falls, using a walker, family wonders if pt needs to escalate from ALF to memory care as the next step.  She lives at [place]. --PT/OT in the am, once   Thyroid nodule, marked enlargement with mass effect upon trachea, mass vs. Goiter, follow up thyroid ultrasound recommended Aortic atherosclerosis  Patient and/or Family completely agreed with the plan, expressed understanding and I answered all questions.  DVT prophylaxis: Lovenox SQ Code Status: DNR, HCPOA has form upstairs and would like to have her DNR Family Communication: talked to daughter and grandson at bedside, Daughter at [phone] would like to be contacted by phone if not in the room to help with decisions Disposition Plan:  likely back to [facility], concerns over ALF to  memory care Consults called:  n/a Admission status: observation for now inpatient because hypoxic and new acute PE   A total of 79 minutes utilized during this admission.  [phone] DO [facility]   If 7PM-7AM, please contact night-coverage [email or web address] Password [facility]  01/29/2022, 8:07 PM

## 2022-01-29 NOTE — ED Notes (Signed)
Lab called to add on troponin and BNP. 

## 2022-01-29 NOTE — ED Triage Notes (Signed)
Pt BIB [facility] from [facility] C/O generalized weakness, decreased appetite, hallucinations.  Multiple recent falls. C/O R arm pain. Hx dementia, A&O X 2, baseline.

## 2022-01-29 NOTE — ED Provider Notes (Signed)
[facility] DEPT Provider Note   CSN: [id number] Arrival date & time: 01/29/22  1038     History  No chief complaint on file.   Ana Walsh is a 88 y.o. female.  HPI     88 year old female comes in with chief complaint of generalized weakness, increased hallucination and reduced appetite.  Patient also allegedly has had multiple falls.  Family at the bedside, daughter is providing substantial part of the history.  Patient has past medical history of dementia and has been residing in assisted living for over a year and a half.  2 weeks ago she had a catatonic spell, leading to ER visit.  MRI of the brain was negative.  Subsequently, patient has had increased weakness.  Patient normally able to walk with her walker, now she is unable to move much at all.  She is also having increased hallucinations per daughter.  Patient denies any new chest pain, shortness of breath, abdominal pain, UTI-like symptoms.  There is no history of recurrent UTI.  Per daughter, patient's Zoloft was increased by 25 mg about 3 weeks ago.  Finally, during patient's vital signs assessment this morning, nursing staff at the nursing facility noted that patient was hypotensive and hypoxic.  Patient was a heavy smoker, but there is no known history of emphysema.  No history of PE, DVT.  Home Medications Prior to Admission medications   Medication Sig Start Date End Date Taking? Authorizing Provider  acetaminophen (TYLENOL) 500 MG tablet Take 500 mg by mouth in the morning and at bedtime.   Yes [provider]  Calcium Carbonate-Vit D-Min (CALCIUM 600+D PLUS MINERALS) 600-400 MG-UNIT TABS Take 1 tablet by mouth in the morning and at bedtime.   Yes [provider]  loperamide (IMODIUM) 2 MG capsule Take 2 mg by mouth daily.   Yes [provider]  predniSONE (DELTASONE) 10 MG tablet Take 10 mg by mouth daily with breakfast.   Yes [provider]   Probiotic Product (ALIGN) 4 MG CAPS Take 4 mg by mouth daily.   Yes [provider]  psyllium (REGULOID) 0.52 g capsule Take 0.52 g by mouth in the morning and at bedtime.   Yes [provider]  risperiDONE (RISPERDAL) 2 MG tablet Take 2 mg by mouth 2 (two) times daily. 01/19/22  Yes [provider]  risperiDONE (RISPERDAL) 0.5 MG tablet Take 0.5 mg by mouth 2 (two) times daily. Take along with 1 mg tab to equal 1.5 mg bid    [provider]  risperiDONE (RISPERDAL) 1 MG tablet Take 1 mg by mouth 2 (two) times daily. Take along with 0.5 mg to equal 1.5 mg bid    [provider]  rivastigmine (EXELON) 4.6 mg/24hr Place 4.6 mg onto the skin daily.    [provider]  sertraline (ZOLOFT) 100 MG tablet Take 100 mg by mouth daily. Take along with 25 mg tab to equal 125 mg    [provider]  sertraline (ZOLOFT) 25 MG tablet Take 25 mg by mouth daily. Take along with 100 mg tab to equal 125 mg    [provider]      Allergies    Dicyclomine, Tamoxifen, Codeine, Doxycycline, Penicillins, Tramadol, Cephalexin, and Gabapentin    Review of Systems   Review of Systems  All other systems reviewed and are negative.   Physical Exam Updated Vital Signs BP 120/65   Pulse 66   Temp 98.4 F (36.9 C) (Oral)  Resp 15   Ht 5\' 5"  (1.651 m)   Wt 77.1 kg   SpO2 96%   BMI 28.29 kg/m  Physical Exam Vitals and nursing note reviewed.  Constitutional:      Appearance: She is well-developed.  HENT:     Head: Atraumatic.  Eyes:     Extraocular Movements: Extraocular movements intact.     Pupils: Pupils are equal, round, and reactive to light.     Comments: No visual field cuts  Cardiovascular:     Rate and Rhythm: Normal rate.  Pulmonary:     Effort: Pulmonary effort is normal.     Breath sounds: No wheezing, rhonchi or rales.  Musculoskeletal:     Cervical back: Normal range of motion and neck supple.  Skin:    General: Skin  is warm and dry.  Neurological:     General: No focal deficit present.     Mental Status: She is alert and oriented to person, place, and time.     Cranial Nerves: No cranial nerve deficit.     Sensory: No sensory deficit.     Motor: No weakness.     ED Results / Procedures / Treatments   Labs (all labs ordered are listed, but only abnormal results are displayed) Labs Reviewed  COMPREHENSIVE METABOLIC PANEL - Abnormal; Notable for the following components:      Result Value   Potassium 3.1 (*)    Glucose, Bld 107 (*)    Total Bilirubin 1.6 (*)    All other components within normal limits  CBC WITH DIFFERENTIAL/PLATELET - Abnormal; Notable for the following components:   WBC 11.7 (*)    Hemoglobin 16.3 (*)    HCT 48.7 (*)    Neutro Abs 10.2 (*)    Abs Immature Granulocytes 0.08 (*)    All other components within normal limits  CBG MONITORING, ED - Abnormal; Notable for the following components:   Glucose-Capillary 103 (*)    All other components within normal limits  RESP PANEL BY RT-PCR (RSV, FLU A&B, COVID)  [id number]  BRAIN NATRIURETIC PEPTIDE  URINALYSIS, ROUTINE W REFLEX MICROSCOPIC  CBG MONITORING, ED  TROPONIN I (HIGH SENSITIVITY)  TROPONIN I (HIGH SENSITIVITY)    EKG EKG Interpretation  Date/Time:  Saturday January 29 2022 10:50:31 EDT Ventricular Rate:  69 PR Interval:  138 QRS Duration: 156 QT Interval:  449 QTC Calculation: 481 R Axis:   230 Text Interpretation: Sinus arrhythmia Nonspecific intraventricular conduction delay No acute changes No significant change since last tracing Confirmed by 03-01-2001 Derwood Kaplan) on 01/29/2022 11:34:25 AM  Radiology DG Chest Port 1 View  Result Date: 01/29/2022 CLINICAL DATA:  Hypoxia. Generalized weakness with decreased appetite and hallucinations. EXAM: PORTABLE CHEST 1 VIEW COMPARISON:  Radiographs 01/11/2022. FINDINGS: 1228 hours. Improved aeration of the lung bases. The heart size and mediastinal contours are stable with  mild aortic atherosclerosis. The lungs are clear. There is no pleural effusion or pneumothorax. Old rib fractures are noted bilaterally, and there are glenohumeral degenerative changes bilaterally. Postsurgical changes are present lower cervical spine and left breast. Telemetry leads overlie the chest. IMPRESSION: No evidence of active cardiopulmonary process. Electronically Signed   By: 01/13/2022 M.D.   On: 01/29/2022 13:10    Procedures .Critical Care  Performed by: 04/01/2022, MD Authorized by: Derwood Kaplan, MD   Critical care provider statement:    Critical care time (minutes):  42   Critical care was necessary to treat or prevent imminent or  life-threatening deterioration of the following conditions:  Respiratory failure   Critical care was time spent personally by me on the following activities:  Development of treatment plan with patient or surrogate, discussions with consultants, evaluation of patient's response to treatment, examination of patient, ordering and review of laboratory studies, ordering and review of radiographic studies, ordering and performing treatments and interventions, pulse oximetry, re-evaluation of patient's condition and review of old charts     Medications Ordered in ED Medications - No data to display  ED Course/ Medical Decision Making/ A&P                           Medical Decision Making Amount and/or Complexity of Data Reviewed Labs: ordered. Radiology: ordered.  Risk Decision regarding hospitalization.    This patient presents to the ED with chief complaint(s) of increased weakness, increased hallucinations, low blood pressure and low oxygen saturation with pertinent past medical history of dementia, but with high function capacity. The complaint involves an extensive differential diagnosis and also carries with it a high risk of complications and morbidity.    Patient is noted to have O2 sats drop to 89 and 88% on room air.  Her  lung exam however is clear and she denies any new cough, chest pain.  There is no history of PE, DVT and no risk factors for the same.  No signs of DVT on our exam.  Patient essentially has constellation of vague symptoms including hallucinations, generalized weakness with falls.  The differential diagnosis includes atypical presentation for ACS, underlying infection such as UTI, PE, pleural effusion, pulmonary edema, hallucination secondary to hypoxemia, hallucinations secondary to medication changes, weakness secondary to medication changes.  Patient neurologic exam is nonfocal.  It does not appear that patient is having serotonin syndrome.   The initial plan is to get basic blood work-up, ambulatory pulse ox, x-ray of the chest. Patient is lucid during my history.  I do not think a CT scan of the brain is indicated.  She denies any headaches and has no focal neurodeficits.   Additional history obtained: Additional history obtained from spouse Records reviewed  I reviewed the MRI of the brain that was completed about 2 weeks ago in the ER, it was negative for stroke.  Independent labs interpretation:  The following labs were independently interpreted: CBC with normal hemoglobin, no elevated troponin or BNP.  Independent visualization of imaging: - I independently visualized the following imaging with scope of interpretation limited to determining acute life threatening conditions related to emergency care: X-ray of the chest, which revealed no evidence of pneumonia, large pleural effusion or pneumothorax  Treatment and Reassessment: Patient was ambulated by the nursing staff.  They indicated that patient's O2 sats dropped into the 70s.  And remained in the 80s despite her getting 2 L of oxygen.  At rest, with 2 L of oxygen her O2 sats increases to 95%.  Patient's O2 sats dropped with minimal stress.  Given this finding, it is possible that patient has been having some hypoxemia, which  could be contributing to the hallucinations she is having and the weakness she is having.  Although my suspicion for PE is low, I think will be important to get a CT angio PE to definitively rule out PE but also give [place] further information on the morphology of this new hypoxia.   Final Clinical Impression(s) / ED Diagnoses Final diagnoses:  Acute hypoxemic respiratory failure (HCC)  Acute weakness  Hallucination    Rx / DC Orders ED Discharge Orders     None         Varney Biles, MD 01/29/22 1537

## 2022-01-29 NOTE — ED Notes (Signed)
Dr. Rhunette Croft at bedside assessing pt.

## 2022-01-30 ENCOUNTER — Observation Stay (HOSPITAL_BASED_OUTPATIENT_CLINIC_OR_DEPARTMENT_OTHER): Payer: Medicare Other

## 2022-01-30 DIAGNOSIS — I2699 Other pulmonary embolism without acute cor pulmonale: Secondary | ICD-10-CM

## 2022-01-30 DIAGNOSIS — F0390 Unspecified dementia without behavioral disturbance: Secondary | ICD-10-CM | POA: Diagnosis present

## 2022-01-30 LAB — COMPREHENSIVE METABOLIC PANEL
ALT: 18 U/L (ref 0–44)
AST: 18 U/L (ref 15–41)
Albumin: 3.5 g/dL (ref 3.5–5.0)
Alkaline Phosphatase: 57 U/L (ref 38–126)
Anion gap: 6 (ref 5–15)
BUN: 13 mg/dL (ref 8–23)
CO2: 30 mmol/L (ref 22–32)
Calcium: 8.8 mg/dL — ABNORMAL LOW (ref 8.9–10.3)
Chloride: 105 mmol/L (ref 98–111)
Creatinine, Ser: 0.66 mg/dL (ref 0.44–1.00)
GFR, Estimated: >60 mL/min (ref >60)
Glucose, Bld: 95 mg/dL (ref 70–99)
Potassium: 3.3 mmol/L — ABNORMAL LOW (ref 3.5–5.1)
Sodium: 141 mmol/L (ref 135–145)
Total Bilirubin: 1 mg/dL (ref 0.3–1.2)
Total Protein: 6.5 g/dL (ref 6.5–8.1)

## 2022-01-30 LAB — CBC
HCT: 48.5 % — ABNORMAL HIGH (ref 36.0–46.0)
Hemoglobin: 15.9 g/dL — ABNORMAL HIGH (ref 12.0–15.0)
MCH: 32.1 pg (ref 26.0–34.0)
MCHC: 32.8 g/dL (ref 30.0–36.0)
MCV: 98 fL (ref 80.0–100.0)
Platelets: 191 10*3/uL (ref 150–400)
RBC: 4.95 MIL/uL (ref 3.87–5.11)
RDW: 13.7 % (ref 11.5–15.5)
WBC: 9.5 10*3/uL (ref 4.0–10.5)
nRBC: 0 % (ref 0.0–0.2)

## 2022-01-30 LAB — MRSA NEXT GEN BY PCR, NASAL: MRSA by PCR Next Gen: NOT DETECTED

## 2022-01-30 MED ORDER — APIXABAN (ELIQUIS) VTE STARTER PACK (10MG AND 5MG): ORAL_TABLET | ORAL | 0 refills | Status: DC

## 2022-01-30 NOTE — Evaluation (Signed)
Physical Therapy Evaluation Patient Details Name: Ana Walsh MRN: [id number] DOB: 1932/08/08 Today's Date: 01/30/2022  History of Present Illness  88 yo female admitted with acute PE, falls, weakness. Hx of dementia  Clinical Impression  On eval, pt required Min A for mobility. She walked ~30 feet during session. She is unsteady. Pt presents with general weakness, and impaired gait/balance. She is confused but she followed commands during session. O2 94% on RA during session. No family present. Recommend HHPT f/u once pt she returns to her facility.        Recommendations for follow up therapy are one component of a multi-disciplinary discharge planning process, led by the attending physician.  Recommendations may be updated based on patient status, additional functional criteria and insurance authorization.  Follow Up Recommendations Home health PT (at facility)      Assistance Recommended at Discharge    Patient can return home with the following  A little help with walking and/or transfers;A little help with bathing/dressing/bathroom;Assistance with cooking/housework;Assist for transportation;Direct supervision/assist for financial management    Equipment Recommendations None recommended by PT  Recommendations for Other Services       Functional Status Assessment Patient has had a recent decline in their functional status and demonstrates the ability to make significant improvements in function in a reasonable and predictable amount of time.     Precautions / Restrictions Precautions Precautions: Fall Precaution Comments: incontinent Restrictions Weight Bearing Restrictions: No      Mobility  Bed Mobility Overal bed mobility: Needs Assistance Bed Mobility: Supine to Sit, Sit to Supine     Supine to sit: Min assist, HOB elevated Sit to supine: Min guard, HOB elevated   General bed mobility comments: Assist for trunk and LEs. Increased time. Cues required.     Transfers Overall transfer level: Needs assistance Equipment used: Rolling walker (2 wheels) Transfers: Sit to/from Stand Sit to Stand: Min assist           General transfer comment: Assist to rise, steady, control descent. Cues required.    Ambulation/Gait Ambulation/Gait assistance: Min assist Gait Distance (Feet): 30 Feet Assistive device: Rolling walker (2 wheels) Gait Pattern/deviations: Step-through pattern, Decreased step length - right, Decreased step length - left, Decreased stride length       General Gait Details: Pt walked to and from bathroom with RW. Intermittent shuffling gait observed. Cues required. Assist to steady pt and navigate walker.  Stairs            Wheelchair Mobility    Modified Rankin (Stroke Patients Only)       Balance Overall balance assessment: Needs assistance, History of Falls         Standing balance support: During functional activity, Reliant on assistive device for balance, Bilateral upper extremity supported Standing balance-Leahy Scale: Poor                               Pertinent Vitals/Pain Pain Assessment Pain Assessment: No/denies pain    Home Living Family/patient expects to be discharged to:: Assisted living                 Home Equipment: Agricultural consultant (2 wheels)      Prior Function               Mobility Comments: ambulatory with walker per chart review       Hand Dominance        Extremity/Trunk Assessment  Upper Extremity Assessment Upper Extremity Assessment: Generalized weakness    Lower Extremity Assessment Lower Extremity Assessment: Generalized weakness    Cervical / Trunk Assessment Cervical / Trunk Assessment: Normal  Communication   Communication: No difficulties  Cognition Arousal/Alertness: Awake/alert Behavior During Therapy: WFL for tasks assessed/performed Overall Cognitive Status: History of cognitive impairments - at baseline                                  General Comments: follows commands        General Comments      Exercises     Assessment/Plan    PT Assessment Patient needs continued PT services  PT Problem List Decreased strength;Decreased balance;Decreased activity tolerance;Decreased mobility;Decreased cognition;Decreased safety awareness;Decreased knowledge of use of DME       PT Treatment Interventions DME instruction;Gait training;Functional mobility training;Therapeutic activities;Balance training;Patient/family education;Therapeutic exercise    PT Goals (Current goals can be found in the Care Plan section)  Acute Rehab PT Goals Patient Stated Goal: none stated PT Goal Formulation: Patient unable to participate in goal setting Time For Goal Achievement: 02/13/22 Potential to Achieve Goals: Good    Frequency Min 3X/week     Co-evaluation               AM-PAC PT "6 Clicks" Mobility  Outcome Measure Help needed turning from your back to your side while in a flat bed without using bedrails?: A Little Help needed moving from lying on your back to sitting on the side of a flat bed without using bedrails?: A Little Help needed moving to and from a bed to a chair (including a wheelchair)?: A Little Help needed standing up from a chair using your arms (e.g., wheelchair or bedside chair)?: A Little Help needed to walk in hospital room?: A Little Help needed climbing 3-5 steps with a railing? : A Little 6 Click Score: 18    End of Session Equipment Utilized During Treatment: Gait belt Activity Tolerance: Patient tolerated treatment well Patient left: in bed;with call bell/phone within reach;with bed alarm set   PT Visit Diagnosis: History of falling (Z91.81);Muscle weakness (generalized) (M62.81);Difficulty in walking, not elsewhere classified (R26.2);Unsteadiness on feet (R26.81)    Time: 2952-8413 PT Time Calculation (min) (ACUTE ONLY): 22 min   Charges:   PT  Evaluation $PT Eval Moderate Complexity: 1 Mod             Faye Ramsay, PT Acute Rehabilitation  Office: [phone] Pager: [phone]

## 2022-01-30 NOTE — Progress Notes (Addendum)
Pt scheduled for discharge to [facility]. PTAR called and awaiting PTAR's arrival for transfer. Pt's daughter, [name], along with 2 other daughters, came up to visit and voiced concerns about patient not being ready for discharge as patient could not walk. Tranee reported that she did not want her mother going back to the facility and falling. PTAR transfer canceled.

## 2022-01-30 NOTE — TOC Transition Note (Signed)
Transition of Care [facility]) - CM/SW Discharge Note   Patient Details  Name: Ana Walsh MRN: [id number] Date of Birth: [birth year]  [facility]) CM/SW Contact:  Larrie Kass, LCSW Phone Number: 01/30/2022, 3:49 PM   Clinical Narrative:    CSW spoke to Onalee Hua with [employer] for O2 referral. O2 will be delivered to p's room prior to d/c.  CSW spoke with pt's daughter to inform her of PT eval recommendations. Pt's daughter stated pt can return back to [place] [facility] [place]. She reported staff will meet EMS at the door. CSW will follow up with the facility on [facility] agencies they contract with to set up [facility] PT services.     Final next level of care: Assisted Living Barriers to Discharge: Barriers Resolved   Patient Goals and CMS Choice Patient states their goals for this hospitalization and ongoing recovery are:: return home CMS Medicare.gov Compare Post Acute Care list provided to:: Patient Represenative (must comment) Choice offered to / list presented to : Adult Children  Discharge Placement                Patient to be transferred to facility by: PTAR Name of family member notified: Zannie Kehr (Daughter)   [phone] Patient and family notified of of transfer: 01/30/22  Discharge Plan and Services In-house Referral: Clinical Social Work              DME Arranged: Oxygen DME Agency: Patsy Lager Date DME Agency Contacted: 01/30/22 Time DME Agency Contacted: [phone] Representative spoke with at DME Agency: Ashly            Social Determinants of Health (SDOH) Interventions     Readmission Risk Interventions     No data to display

## 2022-01-30 NOTE — TOC Initial Note (Signed)
Transition of Care [facility]) - Initial/Assessment Note    Patient Details  Name: Ana Walsh MRN: [id number] Date of Birth: 01/31/1933  Transition of Care [facility]) CM/SW Contact:    Larrie Kass, LCSW Phone Number: 01/30/2022, 10:09 AM  Clinical Narrative:                 CSW spoke with pt's daughter Zannie Kehr in regard to pt's discharge plan. Ms. Deberah Castle stated she plans for pt to return to [facility] if pt is able to ambulate. Ms Kandice Moos stated she spoke with the facility yesterday, and they are willing to accept pt back to memory care if the pt is unable to walk safely on her own. Pt's daughter reported pt's ALF  will have PT come to see her when she is d/c back to the facility. CSW informed pt's daughter Physical Therapy here at the hospital has been ordered to asses her mother's ability to walk. TOC to follow.    Expected Discharge Plan: Assisted Living Barriers to Discharge: Continued Medical Work up   Patient Goals and CMS Choice Patient states their goals for this hospitalization and ongoing recovery are:: return home CMS Medicare.gov Compare Post Acute Care list provided to:: Patient Represenative (must comment) (pt's daughter Liwis,Tranee)    Expected Discharge Plan and Services Expected Discharge Plan: Assisted Living In-house Referral: Clinical Social Work     Living arrangements for the past 2 months: Assisted Living Facility                                      Prior Living Arrangements/Services Living arrangements for the past 2 months: Assisted Living Facility Lives with:: Self Patient language and need for interpreter reviewed:: Yes Do you feel safe going back to the place where you live?: Yes      Need for Family Participation in Patient Care: Yes (Comment) Care giver support system in place?: Yes (comment)      Activities of Daily Living Home Assistive Devices/Equipment: Wheelchair (rolator) ADL Screening (condition at time of  admission) Patient's cognitive ability adequate to safely complete daily activities?: No Is the patient deaf or have difficulty hearing?: No Does the patient have difficulty seeing, even when wearing glasses/contacts?: No Does the patient have difficulty concentrating, remembering, or making decisions?: Yes Patient able to express need for assistance with ADLs?: Yes Does the patient have difficulty dressing or bathing?: Yes Independently performs ADLs?: No Communication: Independent Dressing (OT): Needs assistance Is this a change from baseline?: Pre-admission baseline Grooming: Needs assistance Is this a change from baseline?: Pre-admission baseline Feeding: Independent Bathing: Needs assistance Is this a change from baseline?: Pre-admission baseline Toileting: Needs assistance Is this a change from baseline?: Pre-admission baseline In/Out Bed: Needs assistance Is this a change from baseline?: Pre-admission baseline Walks in Home: Needs assistance Is this a change from baseline?: Pre-admission baseline Does the patient have difficulty walking or climbing stairs?: Yes Weakness of Legs: Both Weakness of Arms/Hands: Both  Permission Sought/Granted   Permission granted to share information with : Yes, Verbal Permission Granted  Share Information with NAME: Liwis,Tranee     Permission granted to share info w Relationship: daughter     Emotional Assessment Appearance:: Appears older than stated age Attitude/Demeanor/Rapport: Gracious Affect (typically observed): Accepting Orientation: : Oriented to Self      Admission diagnosis:  Hallucination [R44.3] Failure to thrive in adult [R62.7] Acute weakness [R53.1] Acute  hypoxemic respiratory failure (HCC) [J96.01] Patient Active Problem List   Diagnosis Date Noted   Failure to thrive in adult 01/29/2022   Acute hypoxemic respiratory failure (HCC) 01/29/2022   Acute pulmonary embolism (HCC) 01/29/2022   PCP:  System, Provider  Not In Pharmacy:   [facility] [phone] - [place], [place] - [address] AT [facility] [address] [place] [address] Phone: [phone] Fax: [phone]     Social Determinants of Health (SDOH) Interventions    Readmission Risk Interventions     No data to display

## 2022-01-30 NOTE — Discharge Instructions (Addendum)
You were cared for by a hospitalist during your hospital stay. If you have any questions about your discharge medications or the care you received while you were in the hospital after you are discharged, you can call the unit and ask to speak with the hospitalist on call if the hospitalist that took care of you is not available. Once you are discharged, your primary care physician will handle any further medical issues. Please note that NO REFILLS for any discharge medications will be authorized once you are discharged, as it is imperative that you return to your primary care physician (or establish a relationship with a primary care physician if you do not have one) for your aftercare needs so that they can reassess your need for medications and monitor your lab values.  Information on my medicine - ELIQUIS (apixaban)  This medication education was reviewed with me or my healthcare representative as part of my discharge preparation.  The pharmacist that spoke with me during my hospital stay was:  Zella Ball, Student-PharmD  Why was Eliquis prescribed for you? Eliquis was prescribed to treat blood clots that may have been found in the veins of your legs (deep vein thrombosis) or in your lungs (pulmonary embolism) and to reduce the risk of them occurring again.  What do You need to know about Eliquis ? The starting dose is 10 mg (two 5 mg tablets) taken TWICE daily for the FIRST SEVEN (7) DAYS, then on  02-27-22  the dose is reduced to ONE 5 mg tablet taken TWICE daily.  Eliquis may be taken with or without food.   Try to take the dose about the same time in the morning and in the evening. If you have difficulty swallowing the tablet whole please discuss with your pharmacist how to take the medication safely.  Take Eliquis exactly as prescribed and DO NOT stop taking Eliquis without talking to the doctor who prescribed the medication.  Stopping may increase your risk of developing a new blood  clot.  Refill your prescription before you run out.  After discharge, you should have regular check-up appointments with your healthcare provider that is prescribing your Eliquis.    What do you do if you miss a dose? If a dose of ELIQUIS is not taken at the scheduled time, take it as soon as possible on the same day and twice-daily administration should be resumed. The dose should not be doubled to make up for a missed dose.  Important Safety Information A possible side effect of Eliquis is bleeding. You should call your healthcare provider right away if you experience any of the following: Bleeding from an injury or your nose that does not stop. Unusual colored urine (red or dark brown) or unusual colored stools (red or black). Unusual bruising for unknown reasons. A serious fall or if you hit your head (even if there is no bleeding).  Some medicines may interact with Eliquis and might increase your risk of bleeding or clotting while on Eliquis. To help avoid this, consult your healthcare provider or pharmacist prior to using any new prescription or non-prescription medications, including herbals, vitamins, non-steroidal anti-inflammatory drugs (NSAIDs) and supplements.  This website has more information on Eliquis (apixaban): http://www.eliquis.com/eliquis/home

## 2022-01-30 NOTE — Progress Notes (Signed)
Bilateral lower extremity venous duplex has been completed. Preliminary results can be found in CV Proc through chart review.  Results were given to the patient's nurse, Vera.  01/30/22 9:34 AM Olen Cordial RVT

## 2022-01-30 NOTE — Discharge Summary (Signed)
Physician Discharge Summary  Terrill Wauters WUJ:[id number] DOB: [birth year] DOA: 01/29/2022  PCP: System, Provider Not In  Admit date: 01/29/2022 Discharge date: 01/30/2022  Admitted From: [facility] Disposition:  [facility]  Recommendations for Outpatient Follow-up:  Follow up with PCP in 1 week Please obtain BMP/CBC in 1 week to ensure stability of K.  She was started on home oxygen. The ideal outcome would be less reliance on this as she remains on newly started Eliquis.   Home Health: Physical therapy  Equipment/Devices: none   Discharge Condition: Good CODE STATUS: DNR  Diet recommendation: General  Brief/Interim Summary: From H&P: From Dr. Thera Flake: "Ana Walsh is a 88 y.o. female with a pertinent history of dementia who presents to the emergency department with generalized weakness, increased hallucination and reduced appetite and has had multiple falls.  Daughter provides a lot of the history.  States that especially for the last 2 weeks she has been weak and difficulty getting out of the chair and using walker has been hunched over.  And when nursing staff noted they checked her saturations to be in the 70's and 80's which improved with some oxygen.  At baseline she has been having hallucinations, non-bothersome which have been helped by neurology and medicines.  They think the last 2 weeks she has probably deconditioned some too and wonder if she should escalate to memory care but HCPOA has had some reassurance from [facility] provider.   Recent ED visit she had a catatonic spell where she was staring down and not responding to questions and MRI of the brain was negative.  At baseline able to use her walker.  Zoloft was increased about 3 weeks ago.     mental status was better, uses a walker in memory unit.     In the ED, 159/84, 98.4 temp, 68 heart rate, 92% to 96% on 2 L of oxygen, RR 16, WBC to 11.7, Hgb 16.3, PLT 199, NA 141, K3.1, CO2 27,  calcium 9.5, glucose 107, troponin 7.  Chest x-ray showed no evidence of active cardiopulmonary process.  CT angio CT PE is being done which I was called to say there was a little PE there without any infiltrate or other acute findings. "  Interim/ Subjective on day of discharge: Patient reports feeling fine.  Family is bedside and reports that when she takes off the oxygen, she is short of breath.  Lower duplex ultrasound negative for DVT.  She is currently on therapeutic Lovenox.  PT is to evaluate the patient to determine placement.  After they saw the patient, she was deemed safe to go home with home physical therapy.  Family is okay with this decision and she will be discharged with home oxygen.  Discharge Diagnoses:  Principal Problem:   Acute pulmonary embolism (HCC) Active Problems:   Failure to thrive in adult   Acute hypoxemic respiratory failure (HCC)   Dementia (HCC)  Transition from Lovenox to Eliquis 10 mg twice daily for 7 days and then transition to 5 mg twice daily.  Hopefully this will improve her oxygen requirements and she can transition off of supplemental oxygen we are sending her home with.  She was evaluated by the physical therapy team who recommended home health physical therapy.  She will continue her chronic home medications.  Discharge Instructions  Discharge Instructions     Call MD for:  difficulty breathing, headache or visual disturbances   Complete by: As directed    Diet -  low sodium heart healthy   Complete by: As directed    Increase activity slowly   Complete by: As directed       Allergies as of 01/30/2022       Reactions   Dicyclomine Anaphylaxis, Swelling   Swelling of tongue   Tamoxifen Anaphylaxis   Codeine Nausea And Vomiting   Doxycycline    unk   Penicillins    unk   Tramadol Other (See Comments)   Drowsiness   Cephalexin Diarrhea   Gabapentin Other (See Comments)   confusion        Medication List     TAKE these  medications    acetaminophen 500 MG tablet Commonly known as: TYLENOL Take 500 mg by mouth in the morning and at bedtime.   Align 4 MG Caps Take 4 mg by mouth daily.   Apixaban Starter Pack (10mg  and 5mg ) Commonly known as: ELIQUIS STARTER PACK Take as directed on package: start with two-5mg  tablets twice daily for 7 days. On day 8, switch to one-5mg  tablet twice daily.   Calcium 600+D Plus Minerals 600-400 MG-UNIT Tabs Take 1 tablet by mouth in the morning and at bedtime.   loperamide 2 MG capsule Commonly known as: IMODIUM Take 2 mg by mouth daily.   predniSONE 10 MG tablet Commonly known as: DELTASONE Take 10 mg by mouth daily with breakfast.   psyllium 0.52 g capsule Commonly known as: REGULOID Take 0.52 g by mouth in the morning and at bedtime.   risperiDONE 2 MG tablet Commonly known as: RISPERDAL Take 2 mg by mouth 2 (two) times daily. What changed: Another medication with the same name was removed. Continue taking this medication, and follow the directions you see here.   rivastigmine 4.6 mg/24hr Commonly known as: EXELON Place 4.6 mg onto the skin daily.   sertraline 100 MG tablet Commonly known as: ZOLOFT Take 100 mg by mouth daily. Take along with 25 mg tab to equal 125 mg   sertraline 25 MG tablet Commonly known as: ZOLOFT Take 25 mg by mouth daily. Take along with 100 mg tab to equal 125 mg               Durable Medical Equipment  (From admission, onward)           Start     Ordered   01/30/22 1514  For home use only DME oxygen  Once       Question Answer Comment  Length of Need 6 Months   Mode or (Route) Nasal cannula   Frequency Continuous (stationary and portable oxygen unit needed)   Oxygen delivery system Gas      01/30/22 1514            Allergies  Allergen Reactions   Dicyclomine Anaphylaxis and Swelling    Swelling of tongue   Tamoxifen Anaphylaxis   Codeine Nausea And Vomiting   Doxycycline     unk   Penicillins      unk   Tramadol Other (See Comments)    Drowsiness   Cephalexin Diarrhea   Gabapentin Other (See Comments)    confusion    Consultations: None   Procedures/Studies: VAS 04/02/22 LOWER EXTREMITY VENOUS (DVT)  Result Date: 01/30/2022  Lower Venous DVT Study Patient Name:  Ana Walsh  Date of Exam:   01/30/2022 Medical Rec #: Doylene Canard       Accession #:    04/02/2022 Date of Birth: [birth year]      Patient  Gender: F Patient Age:   4 years Exam Location:  [facility] Procedure:      VAS [place] LOWER EXTREMITY VENOUS (DVT) Referring Phys: Charlane Ferretti --------------------------------------------------------------------------------  Indications: Pulmonary embolism.  Risk Factors: Confirmed PE. Limitations: Poor ultrasound/tissue interface. Comparison Study: No prior studies. Performing Technologist: Chanda Busing RVT  Examination Guidelines: A complete evaluation includes B-mode imaging, spectral Doppler, color Doppler, and power Doppler as needed of all accessible portions of each vessel. Bilateral testing is considered an integral part of a complete examination. Limited examinations for reoccurring indications may be performed as noted. The reflux portion of the exam is performed with the patient in reverse Trendelenburg.  +---------+---------------+---------+-----------+----------+--------------+ RIGHT    CompressibilityPhasicitySpontaneityPropertiesThrombus Aging +---------+---------------+---------+-----------+----------+--------------+ CFV      Full           Yes      Yes                                 +---------+---------------+---------+-----------+----------+--------------+ SFJ      Full                                                        +---------+---------------+---------+-----------+----------+--------------+ FV Prox  Full                                                        +---------+---------------+---------+-----------+----------+--------------+ FV  Mid   Full                                                        +---------+---------------+---------+-----------+----------+--------------+ FV DistalFull                                                        +---------+---------------+---------+-----------+----------+--------------+ PFV      Full                                                        +---------+---------------+---------+-----------+----------+--------------+ POP      Full           Yes      Yes                                 +---------+---------------+---------+-----------+----------+--------------+ PTV      Full                                                        +---------+---------------+---------+-----------+----------+--------------+  PERO     Full                                                        +---------+---------------+---------+-----------+----------+--------------+   +---------+---------------+---------+-----------+----------+--------------+ LEFT     CompressibilityPhasicitySpontaneityPropertiesThrombus Aging +---------+---------------+---------+-----------+----------+--------------+ CFV      Full           Yes      Yes                                 +---------+---------------+---------+-----------+----------+--------------+ SFJ      Full                                                        +---------+---------------+---------+-----------+----------+--------------+ FV Prox  Full                                                        +---------+---------------+---------+-----------+----------+--------------+ FV Mid   Full                                                        +---------+---------------+---------+-----------+----------+--------------+ FV DistalFull                                                        +---------+---------------+---------+-----------+----------+--------------+ PFV      Full                                                         +---------+---------------+---------+-----------+----------+--------------+ POP      Full           Yes      Yes                                 +---------+---------------+---------+-----------+----------+--------------+ PTV      Full                                                        +---------+---------------+---------+-----------+----------+--------------+ PERO     Full                                                        +---------+---------------+---------+-----------+----------+--------------+  Summary: RIGHT: - There is no evidence of deep vein thrombosis in the lower extremity.  - No cystic structure found in the popliteal fossa.  LEFT: - There is no evidence of deep vein thrombosis in the lower extremity.  - No cystic structure found in the popliteal fossa.  *See table(s) above for measurements and observations. Electronically signed by Heath Lark on 01/30/2022 at 12:22:30 PM.    Final    CT Angio Chest PE W and/or Wo Contrast  Result Date: 01/29/2022 CLINICAL DATA:  High clinical suspicion of pulmonary embolism, weakness, hallucinations EXAM: CT ANGIOGRAPHY CHEST WITH CONTRAST TECHNIQUE: Multidetector CT imaging of the chest was performed using the standard protocol during bolus administration of intravenous contrast. Multiplanar CT image reconstructions and MIPs were obtained to evaluate the vascular anatomy. RADIATION DOSE REDUCTION: This exam was performed according to the departmental dose-optimization program which includes automated exposure control, adjustment of the mA and/or kV according to patient size and/or use of iterative reconstruction technique. CONTRAST:  42mL OMNIPAQUE IOHEXOL 350 MG/ML SOLN IV COMPARISON:  None available FINDINGS: Cardiovascular: Atherosclerotic calcifications aorta without aneurysm or dissection. Heart unremarkable. No pericardial effusion. Pulmonary arteries adequately opacified. Small probable small filling  defect identified within a LEFT lower lobe pulmonary artery branch, likely a small pulmonary embolus. No additional pulmonary emboli. Mediastinum/Nodes: Marked enlargement of RIGHT thyroid lobe with mass effect upon the RIGHT lateral aspect of the trachea, question goiter versus mass. Esophagus unremarkable. No adenopathy. Lungs/Pleura: Dependent atelectasis lower lobes. No pulmonary infiltrate, pleural effusion, or pneumothorax. Upper Abdomen: Visualized upper abdomen unremarkable Musculoskeletal: Osseous structures demineralized with scattered degenerative changes thoracic spine. Review of the MIP images confirms the above findings. IMPRESSION: Single probable small pulmonary embolus in a LEFT lower lobe pulmonary arterial branch as above. Bibasilar atelectasis. Marked enlargement of RIGHT thyroid lobe with mass effect upon trachea, question mass versus goiter; follow-up thyroid ultrasound evaluation recommended. Aortic Atherosclerosis (ICD10-I70.0). Critical Value/emergent results were called by telephone at the time of interpretation on 01/29/2022 at 4:55 pm to provider [facility] , who verbally acknowledged these results. Electronically Signed   By: Ulyses Southward M.D.   On: 01/29/2022 16:55   DG Chest Port 1 View  Result Date: 01/29/2022 CLINICAL DATA:  Hypoxia. Generalized weakness with decreased appetite and hallucinations. EXAM: PORTABLE CHEST 1 VIEW COMPARISON:  Radiographs 01/11/2022. FINDINGS: 1228 hours. Improved aeration of the lung bases. The heart size and mediastinal contours are stable with mild aortic atherosclerosis. The lungs are clear. There is no pleural effusion or pneumothorax. Old rib fractures are noted bilaterally, and there are glenohumeral degenerative changes bilaterally. Postsurgical changes are present lower cervical spine and left breast. Telemetry leads overlie the chest. IMPRESSION: No evidence of active cardiopulmonary process. Electronically Signed   By: Carey Bullocks M.D.    On: 01/29/2022 13:10   MR BRAIN WO CONTRAST  Result Date: 01/12/2022 CLINICAL DATA:  Initial evaluation for acute neuro deficit, stroke suspected. EXAM: MRI HEAD WITHOUT CONTRAST TECHNIQUE: Multiplanar, multiecho pulse sequences of the brain and surrounding structures were obtained without intravenous contrast. COMPARISON:  Prior study from 03/14/2020. FINDINGS: Brain: Generalized age-related cerebral atrophy. Patchy and confluent T2/FLAIR hyperintensity involving the periventricular and deep white matter both cerebral hemispheres as well as the pons, most consistent with chronic small vessel ischemic disease, moderately advanced in nature. No evidence for acute or subacute ischemia. Gray-white matter differentiation maintained. No areas of chronic cortical infarction. No acute or chronic intracranial blood products. No mass lesion, midline shift or mass  effect no hydrocephalus or extra-axial fluid collection. Pituitary gland suprasellar region normal. Vascular: Major intracranial vascular flow voids are maintained. Skull and upper cervical spine: Craniocervical junction normal. Bone marrow signal intensity within normal limits. No scalp soft tissue abnormality. Sinuses/Orbits: Prior bilateral ocular lens replacement. Moderate right maxillary sinusitis noted. Additional scattered mucosal thickening noted within the ethmoidal air cells. No mastoid effusion. Other: None. IMPRESSION: 1. No acute intracranial abnormality. 2. Generalized age-related cerebral atrophy with moderately advanced chronic microvascular ischemic disease. 3. Chronic right maxillary sinusitis. Electronically Signed   By: Rise Mu M.D.   On: 01/12/2022 02:12   DG Skull 1-3 Views  Result Date: 01/11/2022 CLINICAL DATA:  MRI clearance EXAM: CHEST  1 VIEW; SKULL - 1-3 VIEW; ABDOMEN - 1 VIEW COMPARISON:  CT 02/22/2021 FINDINGS: Abdomen: Multiple support leads over the abdomen. Metallic snaps over the left mid abdomen. Nonobstructed  gas pattern with moderate stool. Right hip replacement. Probable metallic zipper artifact over the left hip. Chest: Cardiomegaly. Mild diffuse reticular interstitial opacities suggesting chronic lung disease. No consolidation or effusion. No pneumothorax. Partially visualized hardware in the cervical spine. Metallic clips over the left chest. Old appearing right rib fractures. Skull: Metallic dental implants.  No fracture. IMPRESSION: 1. Right hip replacement. Presumed metallic zipper artifact over the left hip. Otherwise no unexpected metallic density foreign bodies over the abdomen. 2. No unexpected metallic foreign bodies over the chest 3. Dental implants. No unexpected metallic foreign bodies over the skull. Electronically Signed   By: Jasmine Pang M.D.   On: 01/11/2022 22:59   DG Chest 1 View  Result Date: 01/11/2022 CLINICAL DATA:  MRI clearance EXAM: CHEST  1 VIEW; SKULL - 1-3 VIEW; ABDOMEN - 1 VIEW COMPARISON:  CT 02/22/2021 FINDINGS: Abdomen: Multiple support leads over the abdomen. Metallic snaps over the left mid abdomen. Nonobstructed gas pattern with moderate stool. Right hip replacement. Probable metallic zipper artifact over the left hip. Chest: Cardiomegaly. Mild diffuse reticular interstitial opacities suggesting chronic lung disease. No consolidation or effusion. No pneumothorax. Partially visualized hardware in the cervical spine. Metallic clips over the left chest. Old appearing right rib fractures. Skull: Metallic dental implants.  No fracture. IMPRESSION: 1. Right hip replacement. Presumed metallic zipper artifact over the left hip. Otherwise no unexpected metallic density foreign bodies over the abdomen. 2. No unexpected metallic foreign bodies over the chest 3. Dental implants. No unexpected metallic foreign bodies over the skull. Electronically Signed   By: Jasmine Pang M.D.   On: 01/11/2022 22:59   DG Abdomen 1 View  Result Date: 01/11/2022 CLINICAL DATA:  MRI clearance EXAM:  CHEST  1 VIEW; SKULL - 1-3 VIEW; ABDOMEN - 1 VIEW COMPARISON:  CT 02/22/2021 FINDINGS: Abdomen: Multiple support leads over the abdomen. Metallic snaps over the left mid abdomen. Nonobstructed gas pattern with moderate stool. Right hip replacement. Probable metallic zipper artifact over the left hip. Chest: Cardiomegaly. Mild diffuse reticular interstitial opacities suggesting chronic lung disease. No consolidation or effusion. No pneumothorax. Partially visualized hardware in the cervical spine. Metallic clips over the left chest. Old appearing right rib fractures. Skull: Metallic dental implants.  No fracture. IMPRESSION: 1. Right hip replacement. Presumed metallic zipper artifact over the left hip. Otherwise no unexpected metallic density foreign bodies over the abdomen. 2. No unexpected metallic foreign bodies over the chest 3. Dental implants. No unexpected metallic foreign bodies over the skull. Electronically Signed   By: Jasmine Pang M.D.   On: 01/11/2022 22:59    Discharge Exam: Vitals:  01/30/22 0530 01/30/22 1435  BP: 134/67 (!) 153/70  Pulse: 67 71  Resp: 16 20  Temp: 97.7 F (36.5 C) 97.6 F (36.4 C)  SpO2: 95% 95%    General: Pt is awake, not in acute distress Cardiovascular: RRR, S1/S2 +, no edema Respiratory: CTA bilaterally, no wheezing, no rhonchi, no respiratory distress, no conversational dyspnea  Abdominal: Soft, NT, ND, bowel sounds + Extremities: no edema, no cyanosis Psych: Normal mood and affect, limited judgement and insight     The results of significant diagnostics from this hospitalization (including imaging, microbiology, ancillary and laboratory) are listed below for reference.     Microbiology: Recent Results (from the past 240 hour(s))  Resp panel by RT-PCR (RSV, Flu A&B, Covid) Anterior Nasal Swab     Status: None   Collection Time: 01/29/22  1:54 PM   Specimen: Anterior Nasal Swab  Result Value Ref Range Status   SARS Coronavirus 2 by RT PCR  NEGATIVE NEGATIVE Final    Comment: (NOTE) SARS-CoV-2 target nucleic acids are NOT DETECTED.  The SARS-CoV-2 RNA is generally detectable in upper respiratory specimens during the acute phase of infection. The lowest concentration of SARS-CoV-2 viral copies this assay can detect is 138 copies/mL. A negative result does not preclude SARS-Cov-2 infection and should not be used as the sole basis for treatment or other patient management decisions. A negative result may occur with  improper specimen collection/handling, submission of specimen other than nasopharyngeal swab, presence of viral mutation(s) within the areas targeted by this assay, and inadequate number of viral copies(<138 copies/mL). A negative result must be combined with clinical observations, patient history, and epidemiological information. The expected result is Negative.  Fact Sheet for Patients:  BloggerCourse.com  Fact Sheet for Healthcare Providers:  SeriousBroker.it  This test is no t yet approved or cleared by the [place] FDA and  has been authorized for detection and/or diagnosis of SARS-CoV-2 by FDA under an Emergency Use Authorization (EUA). This EUA will remain  in effect (meaning this test can be used) for the duration of the COVID-19 declaration under Section 564(b)(1) of the Act, 21 U.S.C.section 360bbb-3(b)(1), unless the authorization is terminated  or revoked sooner.       Influenza A by PCR NEGATIVE NEGATIVE Final   Influenza B by PCR NEGATIVE NEGATIVE Final    Comment: (NOTE) The Xpert Xpress SARS-CoV-2/FLU/RSV plus assay is intended as an aid in the diagnosis of influenza from Nasopharyngeal swab specimens and should not be used as a sole basis for treatment. Nasal washings and aspirates are unacceptable for Xpert Xpress SARS-CoV-2/FLU/RSV testing.  Fact Sheet for Patients: BloggerCourse.com  Fact Sheet for  Healthcare Providers: SeriousBroker.it  This test is not yet approved or cleared by the [place] FDA and has been authorized for detection and/or diagnosis of SARS-CoV-2 by FDA under an Emergency Use Authorization (EUA). This EUA will remain in effect (meaning this test can be used) for the duration of the COVID-19 declaration under Section 564(b)(1) of the Act, 21 U.S.C. section 360bbb-3(b)(1), unless the authorization is terminated or revoked.     Resp Syncytial Virus by PCR NEGATIVE NEGATIVE Final    Comment: (NOTE) Fact Sheet for Patients: BloggerCourse.com  Fact Sheet for Healthcare Providers: SeriousBroker.it  This test is not yet approved or cleared by the [place] FDA and has been authorized for detection and/or diagnosis of SARS-CoV-2 by FDA under an Emergency Use Authorization (EUA). This EUA will remain in effect (meaning this test can be used)  for the duration of the COVID-19 declaration under Section 564(b)(1) of the Act, 21 U.S.C. section 360bbb-3(b)(1), unless the authorization is terminated or revoked.  Performed at [facility], [address] [place], [place] [address]   MRSA Next Gen by PCR, Nasal     Status: None   Collection Time: 01/29/22  7:32 PM   Specimen: Nasal Mucosa; Nasal Swab  Result Value Ref Range Status   MRSA by PCR Next Gen NOT DETECTED NOT DETECTED Final    Comment: (NOTE) The GeneXpert MRSA Assay (FDA approved for NASAL specimens only), is one component of a comprehensive MRSA colonization surveillance program. It is not intended to diagnose MRSA infection nor to guide or monitor treatment for MRSA infections. Test performance is not FDA approved in patients less than 86 years old. Performed at [facility], [address] [place], [place] [address]      Labs: BNP (last 3 results) Recent Labs     01/29/22 1223  BNP 63.5   Basic Metabolic Panel: Recent Labs  Lab 01/29/22 1221 01/30/22 0707  NA 141 141  K 3.1* 3.3*  CL 104 105  CO2 27 30  GLUCOSE 107* 95  BUN 16 13  CREATININE 0.74 0.66  CALCIUM 9.5 8.8*   Liver Function Tests: Recent Labs  Lab 01/29/22 1221 01/30/22 0707  AST 29 18  ALT 23 18  ALKPHOS 62 57  BILITOT 1.6* 1.0  PROT 6.8 6.5  ALBUMIN 3.8 3.5   No results for input(s): "LIPASE", "AMYLASE" in the last 168 hours. No results for input(s): "AMMONIA" in the last 168 hours. CBC: Recent Labs  Lab 01/29/22 1221 01/30/22 0707  WBC 11.7* 9.5  NEUTROABS 10.2*  --   HGB 16.3* 15.9*  HCT 48.7* 48.5*  MCV 96.2 98.0  PLT 199 191   Cardiac Enzymes: No results for input(s): "CKTOTAL", "CKMB", "CKMBINDEX", "TROPONINI" in the last 168 hours. BNP: Invalid input(s): "POCBNP" CBG: Recent Labs  Lab 01/29/22 1053  GLUCAP 103*   D-Dimer No results for input(s): "DDIMER" in the last 72 hours. Hgb A1c No results for input(s): "HGBA1C" in the last 72 hours. Lipid Profile No results for input(s): "CHOL", "HDL", "LDLCALC", "TRIG", "CHOLHDL", "LDLDIRECT" in the last 72 hours. Thyroid function studies No results for input(s): "TSH", "T4TOTAL", "T3FREE", "THYROIDAB" in the last 72 hours.  Invalid input(s): "FREET3" Anemia work up No results for input(s): "VITAMINB12", "FOLATE", "FERRITIN", "TIBC", "IRON", "RETICCTPCT" in the last 72 hours. Urinalysis    Component Value Date/Time   COLORURINE YELLOW 01/29/2022 1500   APPEARANCEUR HAZY (A) 01/29/2022 1500   LABSPEC 1.019 01/29/2022 1500   PHURINE 5.0 01/29/2022 1500   GLUCOSEU NEGATIVE 01/29/2022 1500   HGBUR SMALL (A) 01/29/2022 1500   BILIRUBINUR NEGATIVE 01/29/2022 1500   KETONESUR NEGATIVE 01/29/2022 1500   PROTEINUR NEGATIVE 01/29/2022 1500   NITRITE POSITIVE (A) 01/29/2022 1500   LEUKOCYTESUR MODERATE (A) 01/29/2022 1500   Sepsis Labs Recent Labs  Lab 01/29/22 1221 01/30/22 0707  WBC 11.7*  9.5   Microbiology Recent Results (from the past 240 hour(s))  Resp panel by RT-PCR (RSV, Flu A&B, Covid) Anterior Nasal Swab     Status: None   Collection Time: 01/29/22  1:54 PM   Specimen: Anterior Nasal Swab  Result Value Ref Range Status   SARS Coronavirus 2 by RT PCR NEGATIVE NEGATIVE Final    Comment: (NOTE) SARS-CoV-2 target nucleic acids are NOT DETECTED.  The SARS-CoV-2 RNA is generally detectable in upper respiratory  specimens during the acute phase of infection. The lowest concentration of SARS-CoV-2 viral copies this assay can detect is 138 copies/mL. A negative result does not preclude SARS-Cov-2 infection and should not be used as the sole basis for treatment or other patient management decisions. A negative result may occur with  improper specimen collection/handling, submission of specimen other than nasopharyngeal swab, presence of viral mutation(s) within the areas targeted by this assay, and inadequate number of viral copies(<138 copies/mL). A negative result must be combined with clinical observations, patient history, and epidemiological information. The expected result is Negative.  Fact Sheet for Patients:  BloggerCourse.com  Fact Sheet for Healthcare Providers:  SeriousBroker.it  This test is no t yet approved or cleared by the [place] FDA and  has been authorized for detection and/or diagnosis of SARS-CoV-2 by FDA under an Emergency Use Authorization (EUA). This EUA will remain  in effect (meaning this test can be used) for the duration of the COVID-19 declaration under Section 564(b)(1) of the Act, 21 U.S.C.section 360bbb-3(b)(1), unless the authorization is terminated  or revoked sooner.       Influenza A by PCR NEGATIVE NEGATIVE Final   Influenza B by PCR NEGATIVE NEGATIVE Final    Comment: (NOTE) The Xpert Xpress SARS-CoV-2/FLU/RSV plus assay is intended as an aid in the diagnosis of  influenza from Nasopharyngeal swab specimens and should not be used as a sole basis for treatment. Nasal washings and aspirates are unacceptable for Xpert Xpress SARS-CoV-2/FLU/RSV testing.  Fact Sheet for Patients: BloggerCourse.com  Fact Sheet for Healthcare Providers: SeriousBroker.it  This test is not yet approved or cleared by the [place] FDA and has been authorized for detection and/or diagnosis of SARS-CoV-2 by FDA under an Emergency Use Authorization (EUA). This EUA will remain in effect (meaning this test can be used) for the duration of the COVID-19 declaration under Section 564(b)(1) of the Act, 21 U.S.C. section 360bbb-3(b)(1), unless the authorization is terminated or revoked.     Resp Syncytial Virus by PCR NEGATIVE NEGATIVE Final    Comment: (NOTE) Fact Sheet for Patients: BloggerCourse.com  Fact Sheet for Healthcare Providers: SeriousBroker.it  This test is not yet approved or cleared by the [place] FDA and has been authorized for detection and/or diagnosis of SARS-CoV-2 by FDA under an Emergency Use Authorization (EUA). This EUA will remain in effect (meaning this test can be used) for the duration of the COVID-19 declaration under Section 564(b)(1) of the Act, 21 U.S.C. section 360bbb-3(b)(1), unless the authorization is terminated or revoked.  Performed at [facility], [address] [place], [place] [address]   MRSA Next Gen by PCR, Nasal     Status: None   Collection Time: 01/29/22  7:32 PM   Specimen: Nasal Mucosa; Nasal Swab  Result Value Ref Range Status   MRSA by PCR Next Gen NOT DETECTED NOT DETECTED Final    Comment: (NOTE) The GeneXpert MRSA Assay (FDA approved for NASAL specimens only), is one component of a comprehensive MRSA colonization surveillance program. It is not intended to diagnose MRSA infection nor to  guide or monitor treatment for MRSA infections. Test performance is not FDA approved in patients less than 71 years old. Performed at [facility], [address], [place], [place] [address]      Patient was seen and examined on the day of discharge and was found to be in stable condition. Time coordinating discharge: 40 minutes including assessment and coordination of care, as well  as examination of the patient.   SIGNED:  Sharlene Dory, DO [facility] 01/30/2022, 3:41 PM

## 2022-01-31 ENCOUNTER — Encounter (HOSPITAL_COMMUNITY): Payer: Self-pay | Admitting: Internal Medicine

## 2022-01-31 ENCOUNTER — Observation Stay (HOSPITAL_COMMUNITY): Payer: Medicare Other

## 2022-01-31 DIAGNOSIS — G9341 Metabolic encephalopathy: Secondary | ICD-10-CM | POA: Diagnosis present

## 2022-01-31 DIAGNOSIS — F0393 Unspecified dementia, unspecified severity, with mood disturbance: Secondary | ICD-10-CM | POA: Diagnosis present

## 2022-01-31 DIAGNOSIS — F0394 Unspecified dementia, unspecified severity, with anxiety: Secondary | ICD-10-CM | POA: Diagnosis present

## 2022-01-31 DIAGNOSIS — I10 Essential (primary) hypertension: Secondary | ICD-10-CM | POA: Diagnosis present

## 2022-01-31 DIAGNOSIS — R627 Adult failure to thrive: Secondary | ICD-10-CM | POA: Diagnosis present

## 2022-01-31 DIAGNOSIS — F0390 Unspecified dementia without behavioral disturbance: Secondary | ICD-10-CM

## 2022-01-31 DIAGNOSIS — Z885 Allergy status to narcotic agent status: Secondary | ICD-10-CM | POA: Diagnosis not present

## 2022-01-31 DIAGNOSIS — Z79899 Other long term (current) drug therapy: Secondary | ICD-10-CM | POA: Diagnosis not present

## 2022-01-31 DIAGNOSIS — Z880 Allergy status to penicillin: Secondary | ICD-10-CM | POA: Diagnosis not present

## 2022-01-31 DIAGNOSIS — Z7952 Long term (current) use of systemic steroids: Secondary | ICD-10-CM | POA: Diagnosis not present

## 2022-01-31 DIAGNOSIS — Z20822 Contact with and (suspected) exposure to covid-19: Secondary | ICD-10-CM | POA: Diagnosis present

## 2022-01-31 DIAGNOSIS — I2699 Other pulmonary embolism without acute cor pulmonale: Secondary | ICD-10-CM | POA: Diagnosis present

## 2022-01-31 DIAGNOSIS — R531 Weakness: Secondary | ICD-10-CM | POA: Diagnosis present

## 2022-01-31 DIAGNOSIS — Z87891 Personal history of nicotine dependence: Secondary | ICD-10-CM | POA: Diagnosis not present

## 2022-01-31 DIAGNOSIS — I959 Hypotension, unspecified: Secondary | ICD-10-CM | POA: Diagnosis present

## 2022-01-31 DIAGNOSIS — F0392 Unspecified dementia, unspecified severity, with psychotic disturbance: Secondary | ICD-10-CM | POA: Diagnosis present

## 2022-01-31 DIAGNOSIS — E041 Nontoxic single thyroid nodule: Secondary | ICD-10-CM | POA: Diagnosis present

## 2022-01-31 DIAGNOSIS — J9601 Acute respiratory failure with hypoxia: Secondary | ICD-10-CM | POA: Diagnosis present

## 2022-01-31 DIAGNOSIS — Z888 Allergy status to other drugs, medicaments and biological substances status: Secondary | ICD-10-CM | POA: Diagnosis not present

## 2022-01-31 DIAGNOSIS — N390 Urinary tract infection, site not specified: Secondary | ICD-10-CM | POA: Diagnosis present

## 2022-01-31 DIAGNOSIS — B9620 Unspecified Escherichia coli [E. coli] as the cause of diseases classified elsewhere: Secondary | ICD-10-CM | POA: Diagnosis present

## 2022-01-31 DIAGNOSIS — E876 Hypokalemia: Secondary | ICD-10-CM | POA: Diagnosis present

## 2022-01-31 DIAGNOSIS — R443 Hallucinations, unspecified: Secondary | ICD-10-CM | POA: Diagnosis present

## 2022-01-31 DIAGNOSIS — Z66 Do not resuscitate: Secondary | ICD-10-CM | POA: Diagnosis present

## 2022-01-31 DIAGNOSIS — R296 Repeated falls: Secondary | ICD-10-CM | POA: Diagnosis present

## 2022-01-31 LAB — BASIC METABOLIC PANEL
Anion gap: 9 (ref 5–15)
BUN: 12 mg/dL (ref 8–23)
CO2: 28 mmol/L (ref 22–32)
Calcium: 8.9 mg/dL (ref 8.9–10.3)
Chloride: 104 mmol/L (ref 98–111)
Creatinine, Ser: 0.88 mg/dL (ref 0.44–1.00)
GFR, Estimated: >60 mL/min (ref >60)
Glucose, Bld: 104 mg/dL — ABNORMAL HIGH (ref 70–99)
Potassium: 3.3 mmol/L — ABNORMAL LOW (ref 3.5–5.1)
Sodium: 141 mmol/L (ref 135–145)

## 2022-01-31 LAB — CBC WITH DIFFERENTIAL/PLATELET
Abs Immature Granulocytes: 0.02 10*3/uL (ref 0.00–0.07)
Basophils Absolute: 0.1 10*3/uL (ref 0.0–0.1)
Basophils Relative: 1 %
Eosinophils Absolute: 0.1 10*3/uL (ref 0.0–0.5)
Eosinophils Relative: 1 %
HCT: 52.5 % — ABNORMAL HIGH (ref 36.0–46.0)
Hemoglobin: 17 g/dL — ABNORMAL HIGH (ref 12.0–15.0)
Immature Granulocytes: 0 %
Lymphocytes Relative: 16 %
Lymphs Abs: 1.5 10*3/uL (ref 0.7–4.0)
MCH: 32.1 pg (ref 26.0–34.0)
MCHC: 32.4 g/dL (ref 30.0–36.0)
MCV: 99.1 fL (ref 80.0–100.0)
Monocytes Absolute: 0.8 10*3/uL (ref 0.1–1.0)
Monocytes Relative: 8 %
Neutro Abs: 7.3 10*3/uL (ref 1.7–7.7)
Neutrophils Relative %: 74 %
Platelets: 193 10*3/uL (ref 150–400)
RBC: 5.3 MIL/uL — ABNORMAL HIGH (ref 3.87–5.11)
RDW: 13.7 % (ref 11.5–15.5)
WBC: 9.9 10*3/uL (ref 4.0–10.5)
nRBC: 0 % (ref 0.0–0.2)

## 2022-01-31 MED ORDER — SODIUM CHLORIDE 0.9 % IV SOLN
1.0000 g | Freq: Every day | INTRAVENOUS | Status: DC
  Administered 2022-01-31 – 2022-02-02 (×3): 1 g via INTRAVENOUS
  Filled 2022-01-31 (×3): qty 10

## 2022-01-31 MED ORDER — SODIUM CHLORIDE 0.9 % IV SOLN: INTRAVENOUS | Status: DC

## 2022-01-31 MED ORDER — POTASSIUM CHLORIDE 10 MEQ/100ML IV SOLN
10.0000 meq | INTRAVENOUS | Status: AC
  Administered 2022-01-31 (×2): 10 meq via INTRAVENOUS
  Filled 2022-01-31 (×2): qty 100

## 2022-01-31 MED ORDER — PREDNISONE 5 MG PO TABS: 10.0000 mg | ORAL_TABLET | Freq: Every day | ORAL | Status: DC

## 2022-01-31 MED ORDER — PREDNISONE 5 MG PO TABS
10.0000 mg | ORAL_TABLET | Freq: Every day | ORAL | Status: DC
  Administered 2022-02-01 – 2022-02-03 (×3): 10 mg via ORAL
  Filled 2022-01-31 (×3): qty 2

## 2022-01-31 MED ORDER — HALOPERIDOL LACTATE 5 MG/ML IJ SOLN
1.0000 mg | Freq: Four times a day (QID) | INTRAMUSCULAR | Status: DC | PRN
  Administered 2022-02-01 – 2022-02-02 (×3): 1 mg via INTRAVENOUS
  Filled 2022-01-31 (×3): qty 1

## 2022-01-31 MED ORDER — SODIUM CHLORIDE 0.9 % IV SOLN: INTRAVENOUS | Status: DC | PRN

## 2022-01-31 NOTE — Care Management Important Message (Signed)
Important Message  Patient Details IM Letter given to Patient's Daughter. Name: Ana Walsh MRN: [id number] Date of Birth: Nov 18, 1932   Medicare Important Message Given:  Yes     Caren Macadam 01/31/2022, 10:31 AM

## 2022-01-31 NOTE — TOC Progression Note (Addendum)
Transition of Care [facility]) - Progression Note    Patient Details  Name: Ana Walsh MRN: [id number] Date of Birth: [birth year]  Transition of Care [facility]) CM/SW Contact  Ana Walsh, [place] Phone Number: 01/31/2022, 1:22 PM  Clinical Narrative:     CSW received a message from bedside nurse that patient's daughter Ana Walsh wanted to speak to CSW.  CSW contacted patient's daughter, who stated that yesterday she was informed that patient was ready to discharge because she had worked with PT and walked 30 feet.  Daughter stated that the plan is for patient to go to the secured ALF unit on Wednesday, she was concerned that patient may not do well in the current ALF setting and patient would fall again and have to return to hospital.     Patient had discharge orders in last night and EMS was called for patient to return back to [facility]F, however daughter saw patient last night and said she was not even able to sit up in the bed.  Daughter did not feel patient was ready for discharge and requested PT to see again today.       CSW explained to patient's daughter that if PT sees patient and feels she needs SNF now, patient is under observation status which means they would have to pay for room and board at a SNF.  If PT does work with patient and she does better, then she would have to discharge back to ALF and CSW can set up home health services if ALF is agreeable to accepting patient back.  CSW also informed patient's daughter that it is a different physician today and will have to see patient and determine if she feels patient is still ready for discharge.  If physician feels she is ready for discharge, and daughter does not feel patient is ready, she can appeal the discharge.    CSW informed her that there is piece of paper that has the phone number to appeal the decision.  CSW told daughter if she does appeal and they rule against her, she would be responsible for the cost from the days since  the discharge order was put in.  Patient's daughter was informed that she would have to address the bill with billing department.  Patient's daughter had some complaints about care, and asked who she can talk to about the care.  CSW advised her to speak to the secretary at the nurse's station and they can reach out to patient experience and the director of the unit so she can discuss concerns.  CSW requested PT to see her today, PT saw her and recommended SNF now because patient was not able to walk, and was a 2 person assist now which is significantly worse then yesterday.  CSW contacted Ana Walsh at [facility] [phone] and updated her on how patient is doing.  Ana Walsh did confirm that plan is for patient to move to the secured ALF side of [facility] on Wednesday, however she will have to see how patient is doing with therapy to see if she can return or needs to go to SNF first.  Per Ana Walsh, they would prefer patient to go to [facility] if possible, because they have been very pleased with the results of some of their other [employer] residents.  CSW to work patient up for SNF placement as a backup in case patient does not improve enough to return to [facility] first.  CSW to continue to follow patient's  progress throughout discharge planning.  6:00pm  CSW spoke to patient's daughter and updated her on the SNF process and therapy recommendations.  Patient's daughter was informed that PT will evaluate her later in the week to see how she is doing.  Based on how patient does later in the will determine if she needs to go to SNF first before going to [facility]F, or if she can discharge back to [facility] from hospital.  CSW updated daughter that CSW spoke to Ana Walsh at [facility] and she would like to see clinical notes on Wednesday to see if patient is able to return or not.  CSW to continue to follow patient's progress throughout discharge planning.   Expected Discharge Plan:  Assisted Living Barriers to Discharge: Barriers Resolved  Expected Discharge Plan and Services Expected Discharge Plan: Assisted Living In-house Referral: Clinical Social Work     Living arrangements for the past 2 months: Assisted Living Facility Expected Discharge Date: 01/30/22               DME Arranged: Oxygen DME Agency: Patsy Lager Date DME Agency Contacted: 01/30/22 Time DME Agency Contacted: 1536 Representative spoke with at DME Agency: Ana Walsh             Social Determinants of Health (SDOH) Interventions    Readmission Risk Interventions     No data to display

## 2022-01-31 NOTE — Progress Notes (Signed)
[facility]                                                                               Ana Walsh, is a 88 y.o. female, DOB - 23-Jul-1933, IEP:[id number] Admit date - 01/29/2022    Outpatient Primary MD for the patient is System, Provider Not In  LOS - 0  days    Brief summary   Ana Walsh is a 88 y.o. female with a pertinent history of dementia who presented to the emergency department with generalized weakness, hallucinations and reduced appetite and has had multiple falls. MRI brain is negative. CTA of the chest shows small PE.    Assessment & Plan    Assessment and Plan:  Small acute pulmonary embolism Acute respiratory failure with hypoxia requiring about 2 to 3 L of oxygen to keep sats greater than 100%. Patient currently on Eliquis. Therapy evaluations to continue and care recommendations.    Dementia Resume home medications at this time.  Acute metabolic encephalopathy in the setting of dementia. Patient's urine analysis abnormal and with her being more lethargic in the last 24 hours we will go ahead and get urine cultures and start her on IV Rocephin.   Patient is on prednisone 10 mg daily for her chronic osteoarthritis for many months now as per the patient's daughter at bedside    Hypokalemia Replaced repeat levels pending.  Estimated body mass index is 24.47 kg/m as calculated from the following:   Height as of this encounter: 5\' 5"  (1.651 m).   Weight as of this encounter: 66.7 kg.  Code Status: DNR.  DVT Prophylaxis:  Place TED hose Start: 01/29/22 2000   Level of Care: Level of care: Med-Surg Family Communication: Updated patient's family at bedside.   Disposition Plan:     Remains inpatient appropriate:  UTI, and acute metabolic encephalopathy.   Procedures:  CT head without contrast.  Consultants:   None.   Antimicrobials:   Anti-infectives (From admission, onward)    Start     Dose/Rate Route Frequency Ordered  Stop   01/31/22 1300  cefTRIAXone (ROCEPHIN) 1 g in sodium chloride 0.9 % 100 mL IVPB        1 g 200 mL/hr over 30 Minutes Intravenous Daily 01/31/22 1145          Medications  Scheduled Meds:  acetaminophen  500 mg Oral BID   enoxaparin (LOVENOX) injection  70 mg Subcutaneous Q12H   [START ON 02/01/2022] predniSONE  10 mg Oral Q breakfast   risperiDONE  2 mg Oral BID   rivastigmine  4.6 mg Transdermal Daily   sertraline  125 mg Oral Daily   Continuous Infusions:  sodium chloride 75 mL/hr at 01/31/22 1500   sodium chloride 10 mL/hr at 01/31/22 1500   cefTRIAXone (ROCEPHIN)  IV Stopped (01/31/22 1333)   PRN Meds:.sodium chloride, acetaminophen **OR** acetaminophen    Subjective:   [name] was seen and examined today.  Family concerned that she has been more sleepy since yesterday evening.   Objective:   Vitals:   01/30/22 1435 01/30/22 1953 01/31/22 0628 01/31/22 1215  BP: (!) 153/70 (!) 158/77 (!) 157/79 113/90  Pulse: 71 72 75 73  Resp: 20 16 16 16   Temp: 97.6 F (36.4 C) 97.7 F (36.5 C) 97.9 F (36.6 C) 97.8 F (36.6 C)  TempSrc: Oral Oral  Oral  SpO2: 95% 97% 99% 98%  Weight:      Height:        Intake/Output Summary (Last 24 hours) at 01/31/2022 1547 Last data filed at 01/31/2022 1500 Gross per 24 hour  Intake 369.27 ml  Output 800 ml  Net -430.73 ml   Filed Weights   01/29/22 1049 01/29/22 1832  Weight: 77.1 kg 66.7 kg     Exam General exam: Appears calm  Respiratory system: Clear to auscultation. Respiratory effort normal. Cardiovascular system: S1 & S2 heard, RRR. No JVD, No pedal edema. Gastrointestinal system: Abdomen is nondistended, soft and nontender. Normal bowel sounds heard. Central nervous system: lethargic, following only simple commands. Required 2 assist during PT eval.  Extremities: no pedal edema.  Skin: No rashes seen.  Psychiatry:  unable to assess.    Data Reviewed:  I have personally reviewed following labs and  imaging studies   CBC Lab Results  Component Value Date   WBC 9.5 01/30/2022   RBC 4.95 01/30/2022   HGB 15.9 (H) 01/30/2022   HCT 48.5 (H) 01/30/2022   MCV 98.0 01/30/2022   MCH 32.1 01/30/2022   PLT 191 01/30/2022   MCHC 32.8 01/30/2022   RDW 13.7 01/30/2022   LYMPHSABS 0.7 01/29/2022   MONOABS 0.6 01/29/2022   EOSABS 0.0 01/29/2022   BASOSABS 0.0 01/29/2022     Last metabolic panel Lab Results  Component Value Date   NA 141 01/30/2022   K 3.3 (L) 01/30/2022   CL 105 01/30/2022   CO2 30 01/30/2022   BUN 13 01/30/2022   CREATININE 0.66 01/30/2022   GLUCOSE 95 01/30/2022   GFRNONAA >60 01/30/2022   CALCIUM 8.8 (L) 01/30/2022   PROT 6.5 01/30/2022   ALBUMIN 3.5 01/30/2022   BILITOT 1.0 01/30/2022   ALKPHOS 57 01/30/2022   AST 18 01/30/2022   ALT 18 01/30/2022   ANIONGAP 6 01/30/2022    CBG (last 3)  Recent Labs    01/29/22 1053  GLUCAP 103*      Coagulation Profile: No results for input(s): "INR", "PROTIME" in the last 168 hours.   Radiology Studies: CT HEAD WO CONTRAST (04/01/22)  Result Date: 01/31/2022 CLINICAL DATA:  Mental status change of unknown cause EXAM: CT HEAD WITHOUT CONTRAST TECHNIQUE: Contiguous axial images were obtained from the base of the skull through the vertex without intravenous contrast. RADIATION DOSE REDUCTION: This exam was performed according to the departmental dose-optimization program which includes automated exposure control, adjustment of the mA and/or kV according to patient size and/or use of iterative reconstruction technique. COMPARISON:  MRI 01/12/2022 FINDINGS: Brain: No acute CT finding. Age related atrophy. Chronic small-vessel ischemic changes of the cerebral hemispheric white matter, thalami and basal ganglia. No sign of acute infarction, mass lesion, hemorrhage, hydrocephalus or extra-axial collection. Vascular: There is atherosclerotic calcification of the major vessels at the base of the brain. Skull: Negative  Sinuses/Orbits: Chronic right maxillary sinusitis. Other sinuses clear. Orbits negative. Other: None IMPRESSION: No acute CT finding. Atrophy. Chronic small-vessel ischemic changes. Chronic right maxillary sinusitis. Electronically Signed   By: 01/14/2022 M.D.   On: 01/31/2022 15:15   VAS 04/03/2022 LOWER EXTREMITY VENOUS (DVT)  Result Date: 01/30/2022  Lower Venous DVT Study Patient Name:  ALWILDA GILLAND  Date of Exam:  01/30/2022 Medical Rec #: [id number]       Accession #:    [id number] Date of Birth: 08/05/1932      Patient Gender: F Patient Age:   76 years Exam Location:  [facility] Procedure:      VAS [place] LOWER EXTREMITY VENOUS (DVT) Referring Phys: Charlane Ferretti --------------------------------------------------------------------------------  Indications: Pulmonary embolism.  Risk Factors: Confirmed PE. Limitations: Poor ultrasound/tissue interface. Comparison Study: No prior studies. Performing Technologist: Chanda Busing RVT  Examination Guidelines: A complete evaluation includes B-mode imaging, spectral Doppler, color Doppler, and power Doppler as needed of all accessible portions of each vessel. Bilateral testing is considered an integral part of a complete examination. Limited examinations for reoccurring indications may be performed as noted. The reflux portion of the exam is performed with the patient in reverse Trendelenburg.  +---------+---------------+---------+-----------+----------+--------------+ RIGHT    CompressibilityPhasicitySpontaneityPropertiesThrombus Aging +---------+---------------+---------+-----------+----------+--------------+ CFV      Full           Yes      Yes                                 +---------+---------------+---------+-----------+----------+--------------+ SFJ      Full                                                        +---------+---------------+---------+-----------+----------+--------------+ FV Prox  Full                                                         +---------+---------------+---------+-----------+----------+--------------+ FV Mid   Full                                                        +---------+---------------+---------+-----------+----------+--------------+ FV DistalFull                                                        +---------+---------------+---------+-----------+----------+--------------+ PFV      Full                                                        +---------+---------------+---------+-----------+----------+--------------+ POP      Full           Yes      Yes                                 +---------+---------------+---------+-----------+----------+--------------+ PTV      Full                                                        +---------+---------------+---------+-----------+----------+--------------+  PERO     Full                                                        +---------+---------------+---------+-----------+----------+--------------+   +---------+---------------+---------+-----------+----------+--------------+ LEFT     CompressibilityPhasicitySpontaneityPropertiesThrombus Aging +---------+---------------+---------+-----------+----------+--------------+ CFV      Full           Yes      Yes                                 +---------+---------------+---------+-----------+----------+--------------+ SFJ      Full                                                        +---------+---------------+---------+-----------+----------+--------------+ FV Prox  Full                                                        +---------+---------------+---------+-----------+----------+--------------+ FV Mid   Full                                                        +---------+---------------+---------+-----------+----------+--------------+ FV DistalFull                                                         +---------+---------------+---------+-----------+----------+--------------+ PFV      Full                                                        +---------+---------------+---------+-----------+----------+--------------+ POP      Full           Yes      Yes                                 +---------+---------------+---------+-----------+----------+--------------+ PTV      Full                                                        +---------+---------------+---------+-----------+----------+--------------+ PERO     Full                                                        +---------+---------------+---------+-----------+----------+--------------+  Summary: RIGHT: - There is no evidence of deep vein thrombosis in the lower extremity.  - No cystic structure found in the popliteal fossa.  LEFT: - There is no evidence of deep vein thrombosis in the lower extremity.  - No cystic structure found in the popliteal fossa.  *See table(s) above for measurements and observations. Electronically signed by Heath Lark on 01/30/2022 at 12:22:30 PM.    Final    CT Angio Chest PE W and/or Wo Contrast  Result Date: 01/29/2022 CLINICAL DATA:  High clinical suspicion of pulmonary embolism, weakness, hallucinations EXAM: CT ANGIOGRAPHY CHEST WITH CONTRAST TECHNIQUE: Multidetector CT imaging of the chest was performed using the standard protocol during bolus administration of intravenous contrast. Multiplanar CT image reconstructions and MIPs were obtained to evaluate the vascular anatomy. RADIATION DOSE REDUCTION: This exam was performed according to the departmental dose-optimization program which includes automated exposure control, adjustment of the mA and/or kV according to patient size and/or use of iterative reconstruction technique. CONTRAST:  21mL OMNIPAQUE IOHEXOL 350 MG/ML SOLN IV COMPARISON:  None available FINDINGS: Cardiovascular: Atherosclerotic calcifications aorta without aneurysm or  dissection. Heart unremarkable. No pericardial effusion. Pulmonary arteries adequately opacified. Small probable small filling defect identified within a LEFT lower lobe pulmonary artery branch, likely a small pulmonary embolus. No additional pulmonary emboli. Mediastinum/Nodes: Marked enlargement of RIGHT thyroid lobe with mass effect upon the RIGHT lateral aspect of the trachea, question goiter versus mass. Esophagus unremarkable. No adenopathy. Lungs/Pleura: Dependent atelectasis lower lobes. No pulmonary infiltrate, pleural effusion, or pneumothorax. Upper Abdomen: Visualized upper abdomen unremarkable Musculoskeletal: Osseous structures demineralized with scattered degenerative changes thoracic spine. Review of the MIP images confirms the above findings. IMPRESSION: Single probable small pulmonary embolus in a LEFT lower lobe pulmonary arterial branch as above. Bibasilar atelectasis. Marked enlargement of RIGHT thyroid lobe with mass effect upon trachea, question mass versus goiter; follow-up thyroid ultrasound evaluation recommended. Aortic Atherosclerosis (ICD10-I70.0). Critical Value/emergent results were called by telephone at the time of interpretation on 01/29/2022 at 4:55 pm to provider [facility] , who verbally acknowledged these results. Electronically Signed   By: Ulyses Southward M.D.   On: 01/29/2022 16:55       Kathlen Mody M.D. [facility] 01/31/2022, 3:47 PM  Available via Epic secure chat 7am-7pm After 7 pm, please refer to night coverage provider listed on amion.

## 2022-01-31 NOTE — Progress Notes (Signed)
Physical Therapy Treatment Patient Details Name: Ana Walsh  MRN: [id number] DOB: 08/07/1932 Today's Date: 01/31/2022   History of Present Illness 88 yo female admitted with acute PE, falls, weakness. Hx of dementia    PT Comments    Pt with marked change in level of arousal and functional status. Extremely lethargic and requiring assist of 2 for bed mobility and STS transfers. With multi-modal stimuli able to arouse pt briefly. Pt attempts to follow one step commands when more alert. Dtrs present for session. SpO2 decr to 87% with sitting EOB on RA. Replaced O2 at 3L with sats 94%. MD present on PT departure, updated on change in status from our standpoint.  Pt may need SNF given change in status.    Recommendations for follow up therapy are one component of a multi-disciplinary discharge planning process, led by the attending physician.  Recommendations may be updated based on patient status, additional functional criteria and insurance authorization.  Follow Up Recommendations  Skilled nursing-short term rehab (<3 hours/day) Can patient physically be transported by private vehicle: No   Assistance Recommended at Discharge Intermittent Supervision/Assistance  Patient can return home with the following Two people to help with walking and/or transfers;Two people to help with bathing/dressing/bathroom;Assistance with cooking/housework;Assist for transportation;Help with stairs or ramp for entrance   Equipment Recommendations  None recommended by PT    Recommendations for Other Services       Precautions / Restrictions Precautions Precautions: Fall Precaution Comments: incontinent, R shoulder pain with mild edema anteriorly Restrictions Weight Bearing Restrictions: No     Mobility  Bed Mobility Overal bed mobility: Needs Assistance Bed Mobility: Rolling, Supine to Sit, Sit to Supine Rolling: Mod assist   Supine to sit: Mod assist, Max assist, HOB elevated Sit to supine:  Max assist, +2 for physical assistance, +2 for safety/equipment   General bed mobility comments: Assist for trunk and LEs. Increased time. cues to self assist    Transfers Overall transfer level: Needs assistance Equipment used: Rolling walker (2 wheels) Transfers: Sit to/from Stand Sit to Stand: Max assist, +2 physical assistance, +2 safety/equipment           General transfer comment: 2 person assist to transition to partial stand, pt unable to come to full stand, hips/trunk/knees flexed    Ambulation/Gait               General Gait Details: unable   Stairs             Wheelchair Mobility    Modified Rankin (Stroke Patients Only)       Balance Overall balance assessment: Needs assistance, History of Falls Sitting-balance support: Single extremity supported, No upper extremity supported, Feet supported, Feet unsupported Sitting balance-Leahy Scale: Fair Sitting balance - Comments: close supervision, able to maintain static sit     Standing balance-Leahy Scale: Zero                              Cognition Arousal/Alertness: Lethargic (has not additional  had meds  per family) Behavior During Therapy: WFL for tasks assessed/performed Overall Cognitive Status: History of cognitive impairments - at baseline                                 General Comments: multi-modal stimuli provided to incr level of arousal. pt remains very lethargic but follows commands with incr time  Exercises      General Comments        Pertinent Vitals/Pain Pain Assessment Pain Assessment: Faces Faces Pain Scale: Hurts whole lot Pain Location: R shoulder, abd Pain Descriptors / Indicators: Discomfort, Grimacing Pain Intervention(s): Limited activity within patient's tolerance, Monitored during session, Repositioned    Home Living                          Prior Function            PT Goals (current goals can now be  found in the care plan section) Acute Rehab PT Goals Patient Stated Goal: none stated PT Goal Formulation: Patient unable to participate in goal setting Time For Goal Achievement: 02/13/22 Potential to Achieve Goals: Good Progress towards PT goals: Progressing toward goals (slowly)    Frequency    Min 3X/week      PT Plan Current plan remains appropriate    Co-evaluation              AM-PAC PT "6 Clicks" Mobility   Outcome Measure  Help needed turning from your back to your side while in a flat bed without using bedrails?: Total Help needed moving from lying on your back to sitting on the side of a flat bed without using bedrails?: Total Help needed moving to and from a bed to a chair (including a wheelchair)?: Total Help needed standing up from a chair using your arms (e.g., wheelchair or bedside chair)?: Total Help needed to walk in hospital room?: Total Help needed climbing 3-5 steps with a railing? : Total 6 Click Score: 6    End of Session Equipment Utilized During Treatment: Gait belt Activity Tolerance: Patient limited by lethargy Patient left: in bed;with call bell/phone within reach;with bed alarm set;with family/visitor present   PT Visit Diagnosis: History of falling (Z91.81);Muscle weakness (generalized) (M62.81);Difficulty in walking, not elsewhere classified (R26.2);Unsteadiness on feet (R26.81)     Time: 0981-1914 PT Time Calculation (min) (ACUTE ONLY): 32 min  Charges:  $Therapeutic Activity: 23-37 mins                     Delice Bison, PT  Acute Rehab Dept [facility]) [phone]  [facility] Weekend Pager [facility] only)  [phone]  01/31/2022    [facility] 01/31/2022, 11:56 AM

## 2022-02-01 DIAGNOSIS — G9341 Metabolic encephalopathy: Secondary | ICD-10-CM | POA: Diagnosis not present

## 2022-02-01 DIAGNOSIS — F0390 Unspecified dementia without behavioral disturbance: Secondary | ICD-10-CM | POA: Diagnosis not present

## 2022-02-01 DIAGNOSIS — J9601 Acute respiratory failure with hypoxia: Secondary | ICD-10-CM | POA: Diagnosis not present

## 2022-02-01 DIAGNOSIS — I2699 Other pulmonary embolism without acute cor pulmonale: Secondary | ICD-10-CM | POA: Diagnosis not present

## 2022-02-01 LAB — CBC WITH DIFFERENTIAL/PLATELET
Abs Immature Granulocytes: 0.05 10*3/uL (ref 0.00–0.07)
Basophils Absolute: 0.1 10*3/uL (ref 0.0–0.1)
Basophils Relative: 1 %
Eosinophils Absolute: 0.1 10*3/uL (ref 0.0–0.5)
Eosinophils Relative: 1 %
HCT: 51.2 % — ABNORMAL HIGH (ref 36.0–46.0)
Hemoglobin: 16.5 g/dL — ABNORMAL HIGH (ref 12.0–15.0)
Immature Granulocytes: 1 %
Lymphocytes Relative: 17 %
Lymphs Abs: 1.5 10*3/uL (ref 0.7–4.0)
MCH: 31.8 pg (ref 26.0–34.0)
MCHC: 32.2 g/dL (ref 30.0–36.0)
MCV: 98.7 fL (ref 80.0–100.0)
Monocytes Absolute: 0.8 10*3/uL (ref 0.1–1.0)
Monocytes Relative: 8 %
Neutro Abs: 6.6 10*3/uL (ref 1.7–7.7)
Neutrophils Relative %: 72 %
Platelets: 208 10*3/uL (ref 150–400)
RBC: 5.19 MIL/uL — ABNORMAL HIGH (ref 3.87–5.11)
RDW: 13.6 % (ref 11.5–15.5)
WBC: 9.2 10*3/uL (ref 4.0–10.5)
nRBC: 0 % (ref 0.0–0.2)

## 2022-02-01 LAB — COMPREHENSIVE METABOLIC PANEL
ALT: 17 U/L (ref 0–44)
AST: 14 U/L — ABNORMAL LOW (ref 15–41)
Albumin: 3.3 g/dL — ABNORMAL LOW (ref 3.5–5.0)
Alkaline Phosphatase: 58 U/L (ref 38–126)
Anion gap: 8 (ref 5–15)
BUN: 11 mg/dL (ref 8–23)
CO2: 27 mmol/L (ref 22–32)
Calcium: 8.7 mg/dL — ABNORMAL LOW (ref 8.9–10.3)
Chloride: 107 mmol/L (ref 98–111)
Creatinine, Ser: 0.61 mg/dL (ref 0.44–1.00)
GFR, Estimated: >60 mL/min (ref >60)
Glucose, Bld: 96 mg/dL (ref 70–99)
Potassium: 3.4 mmol/L — ABNORMAL LOW (ref 3.5–5.1)
Sodium: 142 mmol/L (ref 135–145)
Total Bilirubin: 1 mg/dL (ref 0.3–1.2)
Total Protein: 6.4 g/dL — ABNORMAL LOW (ref 6.5–8.1)

## 2022-02-01 MED ORDER — POTASSIUM CHLORIDE CRYS ER 20 MEQ PO TBCR
40.0000 meq | EXTENDED_RELEASE_TABLET | Freq: Once | ORAL | Status: AC
  Administered 2022-02-01: 40 meq via ORAL
  Filled 2022-02-01: qty 2

## 2022-02-01 NOTE — Progress Notes (Signed)
[facility]                                                                               Ana Walsh, is a 88 y.o. female, DOB - 1933-07-17, VOJ:[id number] Admit date - 01/29/2022    Outpatient Primary MD for the patient is System, Provider Not In  LOS - 1  days    Brief summary   Ana Walsh is a 88 y.o. female with a pertinent history of dementia who presented to the emergency department with generalized weakness, hallucinations and reduced appetite and has had multiple falls. MRI brain is negative. CTA of the chest shows small PE. She was started on lovenox and transitioned to oral eliquis. On Sunday patient became encephalopathic possibly from UTI.  Repeat therapy eval recommending SNF. Toc on board.    Assessment & Plan    Assessment and Plan:  Small acute pulmonary embolism Acute respiratory failure with hypoxia requiring about 2 to 3 L of oxygen to keep sats greater than 100%. Patient currently on Eliquis. She will need to be discharged on oxygen.  Therapy evaluations recommending SNF.     Dementia Resume home medications at this time.  Acute metabolic encephalopathy in the setting of dementia. Patient's urine analysis abnormal and with her being more lethargic in the last 24 hours we will go ahead and get urine cultures and start her on IV Rocephin. Unfortunately urine cultures were obtained from IV Rocephin was given.  She is still lethargic, but improving.     Patient is on prednisone 10 mg daily for her chronic osteoarthritis for many months now as per the patient's daughter at bedside    Hypokalemia Replaced repeat levels  in am.   Estimated body mass index is 24.47 kg/m as calculated from the following:   Height as of this encounter: 5\' 5"  (1.651 m).   Weight as of this encounter: 66.7 kg.  Code Status: DNR.  DVT Prophylaxis:  Place TED hose Start: 01/29/22 2000   Level of Care: Level of care: Med-Surg Family Communication:  Updated patient's family at bedside.   Disposition Plan:     Remains inpatient appropriate:  UTI, and acute metabolic encephalopathy.   Procedures:  CT head without contrast.  Consultants:   None.   Antimicrobials:   Anti-infectives (From admission, onward)    Start     Dose/Rate Route Frequency Ordered Stop   01/31/22 1300  cefTRIAXone (ROCEPHIN) 1 g in sodium chloride 0.9 % 100 mL IVPB        1 g 200 mL/hr over 30 Minutes Intravenous Daily 01/31/22 1145          Medications  Scheduled Meds:  acetaminophen  500 mg Oral BID   enoxaparin (LOVENOX) injection  70 mg Subcutaneous Q12H   predniSONE  10 mg Oral Q breakfast   risperiDONE  2 mg Oral BID   rivastigmine  4.6 mg Transdermal Daily   sertraline  125 mg Oral Daily   Continuous Infusions:  sodium chloride 75 mL/hr at 02/01/22 0543   sodium chloride Stopped (01/31/22 2019)   cefTRIAXone (ROCEPHIN)  IV 1 g (02/01/22 1225)   PRN Meds:.sodium chloride, acetaminophen **OR**  acetaminophen, haloperidol lactate    Subjective:   Ana Walsh was seen and examined today.  She is still lethargic,  But better than yesterday. No chest pain or sob.   Objective:   Vitals:   01/31/22 0628 01/31/22 1215 01/31/22 2239 02/01/22 0559  BP: (!) 157/79 113/90 (!) 100/52 (!) 129/52  Pulse: 75 73 68 63  Resp: 16 16 16 16   Temp: 97.9 F (36.6 C) 97.8 F (36.6 C) (!) 97.5 F (36.4 C) 98.7 F (37.1 C)  TempSrc:  Oral Oral Oral  SpO2: 99% 98% 98%   Weight:      Height:        Intake/Output Summary (Last 24 hours) at 02/01/2022 1303 Last data filed at 02/01/2022 0543 Gross per 24 hour  Intake 1389.15 ml  Output 500 ml  Net 889.15 ml    Filed Weights   01/29/22 1049 01/29/22 1832  Weight: 77.1 kg 66.7 kg     Exam General exam: Appears calm and comfortable  Respiratory system: Clear to auscultation. Respiratory effort normal. Cardiovascular system: S1 & S2 heard, RRR. No JVD, murmurs, rubs, gallops or clicks. No pedal  edema. Gastrointestinal system: Abdomen is nondistended, soft and nontender. Normal bowel sounds heard. Central nervous system: Alert and oriented to person only.  Extremities: Symmetric 5 x 5 power. Skin: No rashes, lesions or ulcers Psychiatry: mood is appropriate.     Data Reviewed:  I have personally reviewed following labs and imaging studies   CBC Lab Results  Component Value Date   WBC 9.2 02/01/2022   RBC 5.19 (H) 02/01/2022   HGB 16.5 (H) 02/01/2022   HCT 51.2 (H) 02/01/2022   MCV 98.7 02/01/2022   MCH 31.8 02/01/2022   PLT 208 02/01/2022   MCHC 32.2 02/01/2022   RDW 13.6 02/01/2022   LYMPHSABS 1.5 02/01/2022   MONOABS 0.8 02/01/2022   EOSABS 0.1 02/01/2022   BASOSABS 0.1 02/01/2022     Last metabolic panel Lab Results  Component Value Date   NA 142 02/01/2022   K 3.4 (L) 02/01/2022   CL 107 02/01/2022   CO2 27 02/01/2022   BUN 11 02/01/2022   CREATININE 0.61 02/01/2022   GLUCOSE 96 02/01/2022   GFRNONAA >60 02/01/2022   CALCIUM 8.7 (L) 02/01/2022   PROT 6.4 (L) 02/01/2022   ALBUMIN 3.3 (L) 02/01/2022   BILITOT 1.0 02/01/2022   ALKPHOS 58 02/01/2022   AST 14 (L) 02/01/2022   ALT 17 02/01/2022   ANIONGAP 8 02/01/2022    CBG (last 3)  No results for input(s): "GLUCAP" in the last 72 hours.     Coagulation Profile: No results for input(s): "INR", "PROTIME" in the last 168 hours.   Radiology Studies: CT HEAD WO CONTRAST (04/04/2022)  Result Date: 01/31/2022 CLINICAL DATA:  Mental status change of unknown cause EXAM: CT HEAD WITHOUT CONTRAST TECHNIQUE: Contiguous axial images were obtained from the base of the skull through the vertex without intravenous contrast. RADIATION DOSE REDUCTION: This exam was performed according to the departmental dose-optimization program which includes automated exposure control, adjustment of the mA and/or kV according to patient size and/or use of iterative reconstruction technique. COMPARISON:  MRI 01/12/2022 FINDINGS:  Brain: No acute CT finding. Age related atrophy. Chronic small-vessel ischemic changes of the cerebral hemispheric white matter, thalami and basal ganglia. No sign of acute infarction, mass lesion, hemorrhage, hydrocephalus or extra-axial collection. Vascular: There is atherosclerotic calcification of the major vessels at the base of the brain. Skull: Negative Sinuses/Orbits: Chronic  right maxillary sinusitis. Other sinuses clear. Orbits negative. Other: None IMPRESSION: No acute CT finding. Atrophy. Chronic small-vessel ischemic changes. Chronic right maxillary sinusitis. Electronically Signed   By: Paulina Fusi M.D.   On: 01/31/2022 15:15       Kathlen Mody M.D. [facility] 02/01/2022, 1:03 PM  Available via Epic secure chat 7am-7pm After 7 pm, please refer to night coverage provider listed on amion.

## 2022-02-01 NOTE — NC FL2 (Signed)
[place] MEDICAID FL2 LEVEL OF CARE SCREENING TOOL     IDENTIFICATION  Patient Name: Ana Walsh Birthdate: 12/03/1932 Sex: female Admission Date (Current Location): 01/29/2022  [facility] and [place] Number:  Producer, television/film/video and Address:  [facility],  [address] [place], [place] [address]      Provider Number: [id number]  Attending Physician Name and Address:  Kathlen Mody, MD  Relative Name and Phone Number:  Zannie Kehr Daughter   [phone]    Current Level of Care: Hospital Recommended Level of Care: Skilled Nursing Facility Prior Approval Number:    Date Approved/Denied:   PASRR Number: [id number]  Discharge Plan: SNF    Current Diagnoses: Patient Active Problem List   Diagnosis Date Noted   Acute metabolic encephalopathy 01/31/2022   Dementia (HCC) 01/30/2022   Failure to thrive in adult 01/29/2022   Acute hypoxemic respiratory failure (HCC) 01/29/2022   Acute pulmonary embolism (HCC) 01/29/2022    Orientation RESPIRATION BLADDER Height & Weight     Self  Normal Incontinent Weight: 147 lb 0.8 oz (66.7 kg) Height:  5\' 5"  (165.1 cm)  BEHAVIORAL SYMPTOMS/MOOD NEUROLOGICAL BOWEL NUTRITION STATUS      Continent Diet (Cardiac)  AMBULATORY STATUS COMMUNICATION OF NEEDS Skin   Limited Assist Verbally Normal                       Personal Care Assistance Level of Assistance  Bathing, Feeding, Dressing Bathing Assistance: Limited assistance Feeding assistance: Independent Dressing Assistance: Limited assistance     Functional Limitations Info  Sight, Hearing, Speech Sight Info: Adequate Hearing Info: Adequate Speech Info: Adequate    SPECIAL CARE FACTORS FREQUENCY  PT (By licensed PT), OT (By licensed OT)     PT Frequency: Minimum 5x a week OT Frequency: Minimum 5x a week            Contractures Contractures Info: Not present    Additional Factors Info  Code Status, Allergies, Psychotropic Code Status Info:  DNR Allergies Info: Dicyclomine   Tamoxifen   Codeine   Doxycycline   Penicillins   Tramadol   Cephalexin   Gabapentin Psychotropic Info: risperiDONE (RISPERDAL) tablet 2 mg, sertraline (ZOLOFT) tablet 125 mg         Current Medications (02/01/2022):  This is the current hospital active medication list Current Facility-Administered Medications  Medication Dose Route Frequency Provider Last Rate Last Admin   0.9 %  sodium chloride infusion   Intravenous Continuous 04/04/2022, MD 75 mL/hr at 02/01/22 0543 Infusion Verify at 02/01/22 0543   0.9 %  sodium chloride infusion   Intravenous PRN 04/04/22, MD   Stopped at 01/31/22 2019   acetaminophen (TYLENOL) tablet 650 mg  650 mg Oral Q6H PRN 2020, DO       Or   acetaminophen (TYLENOL) suppository 650 mg  650 mg Rectal Q6H PRN Charlane Ferretti, DO       acetaminophen (TYLENOL) tablet 500 mg  500 mg Oral BID Charlane Ferretti, DO   500 mg at 01/31/22 2107   cefTRIAXone (ROCEPHIN) 1 g in sodium chloride 0.9 % 100 mL IVPB  1 g Intravenous Daily 2108, MD   Stopped at 01/31/22 1333   enoxaparin (LOVENOX) injection 70 mg  70 mg Subcutaneous Q12H 04/03/22, DO   70 mg at 01/31/22 2107   haloperidol lactate (HALDOL) injection 1 mg  1 mg Intravenous Q6H PRN 2108, MD       potassium  chloride SA (KLOR-CON M) CR tablet 40 mEq  40 mEq Oral Once Kathlen Mody, MD       predniSONE (DELTASONE) tablet 10 mg  10 mg Oral Q breakfast Kathlen Mody, MD       risperiDONE (RISPERDAL) tablet 2 mg  2 mg Oral BID Charlane Ferretti, DO   2 mg at 01/31/22 2107   rivastigmine (EXELON) 4.6 mg/24hr 4.6 mg  4.6 mg Transdermal Daily Charlane Ferretti, DO   4.6 mg at 01/31/22 1013   sertraline (ZOLOFT) tablet 125 mg  125 mg Oral Daily Charlane Ferretti, DO   125 mg at 01/31/22 1010     Discharge Medications: Please see discharge summary for a list of discharge medications.  Relevant Imaging Results:  Relevant Lab Results:   Additional  Information SSN [id number]  Darleene Cleaver, LCSW

## 2022-02-02 DIAGNOSIS — R627 Adult failure to thrive: Secondary | ICD-10-CM

## 2022-02-02 DIAGNOSIS — B9620 Unspecified Escherichia coli [E. coli] as the cause of diseases classified elsewhere: Secondary | ICD-10-CM | POA: Diagnosis present

## 2022-02-02 DIAGNOSIS — F0390 Unspecified dementia without behavioral disturbance: Secondary | ICD-10-CM | POA: Diagnosis not present

## 2022-02-02 DIAGNOSIS — N390 Urinary tract infection, site not specified: Secondary | ICD-10-CM

## 2022-02-02 DIAGNOSIS — I2699 Other pulmonary embolism without acute cor pulmonale: Secondary | ICD-10-CM | POA: Diagnosis not present

## 2022-02-02 DIAGNOSIS — J9601 Acute respiratory failure with hypoxia: Secondary | ICD-10-CM | POA: Diagnosis not present

## 2022-02-02 DIAGNOSIS — G9341 Metabolic encephalopathy: Secondary | ICD-10-CM | POA: Diagnosis not present

## 2022-02-02 LAB — URINE CULTURE: Culture: 20000 — AB

## 2022-02-02 LAB — BASIC METABOLIC PANEL
Anion gap: 8 (ref 5–15)
BUN: 9 mg/dL (ref 8–23)
CO2: 26 mmol/L (ref 22–32)
Calcium: 8.9 mg/dL (ref 8.9–10.3)
Chloride: 105 mmol/L (ref 98–111)
Creatinine, Ser: 0.6 mg/dL (ref 0.44–1.00)
GFR, Estimated: >60 mL/min (ref >60)
Glucose, Bld: 101 mg/dL — ABNORMAL HIGH (ref 70–99)
Potassium: 3.4 mmol/L — ABNORMAL LOW (ref 3.5–5.1)
Sodium: 139 mmol/L (ref 135–145)

## 2022-02-02 MED ORDER — POTASSIUM CHLORIDE CRYS ER 10 MEQ PO TBCR
30.0000 meq | EXTENDED_RELEASE_TABLET | ORAL | Status: AC
Start: 2022-02-02 — End: 2022-02-02
  Administered 2022-02-02 (×2): 30 meq via ORAL
  Filled 2022-02-02 (×2): qty 1

## 2022-02-02 MED ORDER — APIXABAN 5 MG PO TABS: 5.0000 mg | ORAL_TABLET | Freq: Two times a day (BID) | ORAL | Status: DC

## 2022-02-02 MED ORDER — APIXABAN 5 MG PO TABS
10.0000 mg | ORAL_TABLET | Freq: Two times a day (BID) | ORAL | Status: DC
  Administered 2022-02-02 – 2022-02-03 (×3): 10 mg via ORAL
  Filled 2022-02-02 (×3): qty 2

## 2022-02-02 MED ORDER — CEFADROXIL 500 MG PO CAPS
500.0000 mg | ORAL_CAPSULE | Freq: Two times a day (BID) | ORAL | Status: DC
Start: 2022-02-02 — End: 2022-02-04
  Administered 2022-02-02 – 2022-02-03 (×2): 500 mg via ORAL
  Filled 2022-02-02 (×3): qty 1

## 2022-02-02 NOTE — Progress Notes (Signed)
ANTICOAGULATION CONSULT NOTE - Initial Consult  Pharmacy Consult for Lovenox >> Eliquis Indication: pulmonary embolus  Allergies  Allergen Reactions   Dicyclomine Anaphylaxis and Swelling    Swelling of tongue   Tamoxifen Anaphylaxis   Codeine Nausea And Vomiting   Doxycycline     unk   Penicillins     unk   Tramadol Other (See Comments)    Drowsiness   Cephalexin Diarrhea   Gabapentin Other (See Comments)    confusion    Patient Measurements: Height: 5\' 5"  (165.1 cm) Weight: 66.7 kg (147 lb 0.8 oz) IBW/kg (Calculated) : 57 Heparin Dosing Weight: 73 kg  Vital Signs: Temp: 98.1 F (36.7 C) (07/05 0557) Temp Source: Oral (07/05 0557) BP: 180/61 (07/05 0557) Pulse Rate: 68 (07/05 0557)  Labs: Recent Labs    01/31/22 1612 02/01/22 0543 02/02/22 0538  HGB 17.0* 16.5*  --   HCT 52.5* 51.2*  --   PLT 193 208  --   CREATININE 0.88 0.61 0.60    Estimated Creatinine Clearance: 45.4 mL/min (by C-G formula based on SCr of 0.6 mg/dL).   Medical History: Past Medical History:  Diagnosis Date   Hypertension     Medications:  Scheduled:   acetaminophen  500 mg Oral BID   apixaban  10 mg Oral BID   Followed by   04/05/22 ON 2022-02-20] apixaban  5 mg Oral BID   potassium chloride  30 mEq Oral Q3H   predniSONE  10 mg Oral Q breakfast   risperiDONE  2 mg Oral BID   rivastigmine  4.6 mg Transdermal Daily   sertraline  125 mg Oral Daily    Assessment: 88 yo female who presented to [facility] with generalized weakness, hallucinations, reduced appetite and multiple falls.  MRI brain was negative but CTA of the chest showed a small pulmonary embolism. Dopplers negative for DVT. Patient was started on Lovenox 1mg /kg [place] q12 hours. Pharmacy has been consulted to transition Lovenox to Eliquis in preparation for discharge to SNF.  Goal of Therapy:  Monitor platelets by anticoagulation protocol: Yes   Plan:  Discontinue Lovenox  Begin Eliquis 10mg  PO BID x 7 days, followed by  5mg  PO BID First dose of Eliquis to be given at 1000 this AM  [facility], PharmD 02/02/2022 7:11 AM

## 2022-02-02 NOTE — TOC Progression Note (Addendum)
Transition of Care [facility]) - Progression Note    Patient Details  Name: Ana Walsh MRN: [id number] Date of Birth: 07-Jun-1933  Transition of Care [facility] CM/SW Contact  Bernis Schreur, Meriam Sprague, RN Phone Number: 02/02/2022, 3:44 PM  Clinical Narrative:    Newest PT note faxed to Donnamae Jude at [facility] at [facility] [phone].   Spoke with daughter [name] to let her know that PT note was faxed and we would wait to hear back from [facility] to find out if they can accommodate her at her current level of function or if she will need to go to SNF. TOC will follow.    Expected Discharge Plan: Assisted Living Barriers to Discharge: Barriers Resolved  Expected Discharge Plan and Services Expected Discharge Plan: Assisted Living In-house Referral: Clinical Social Work     Living arrangements for the past 2 months: Assisted Living Facility Expected Discharge Date: 01/30/22               DME Arranged: Oxygen DME Agency: Patsy Lager Date DME Agency Contacted: 01/30/22 Time DME Agency Contacted: 1536 Representative spoke with at DME Agency: Ashly             Social Determinants of Health (SDOH) Interventions    Readmission Risk Interventions     No data to display

## 2022-02-02 NOTE — Progress Notes (Signed)
Non-violent Restraints discontinued.

## 2022-02-02 NOTE — Progress Notes (Signed)
Physical Therapy Treatment Patient Details Name: Ana Walsh MRN: [id number] DOB: 1933-04-21 Today's Date: 02/02/2022   History of Present Illness 88 yo female admitted with acute PE, falls, weakness. Hx of dementia    PT Comments    Progressing with mobility. Remained on [place] O2-2L-sats 95%. Multimodal cueing and increased time required. Continue to recommend ST SNF rehab.    Recommendations for follow up therapy are one component of a multi-disciplinary discharge planning process, led by the attending physician.  Recommendations may be updated based on patient status, additional functional criteria and insurance authorization.  Follow Up Recommendations  Skilled nursing-short term rehab (<3 hours/day) Can patient physically be transported by private vehicle: No   Assistance Recommended at Discharge Frequent or constant Supervision/Assistance  Patient can return home with the following Assist for transportation;Assistance with cooking/housework;Help with stairs or ramp for entrance;A little help with walking and/or transfers;A lot of help with bathing/dressing/bathroom   Equipment Recommendations  None recommended by PT    Recommendations for Other Services       Precautions / Restrictions Precautions Precautions: Fall Precaution Comments: incontinent Restrictions Weight Bearing Restrictions: No     Mobility  Bed Mobility Overal bed mobility: Needs Assistance Bed Mobility: Supine to Sit, Sit to Supine     Supine to sit: Mod assist, HOB elevated Sit to supine: Mod assist, HOB elevated   General bed mobility comments: Assist for trunk and LEs. Increased time. Multimodal cueing required.    Transfers Overall transfer level: Needs assistance Equipment used: Rolling walker (2 wheels) Transfers: Sit to/from Stand Sit to Stand: Min assist, +2 safety/equipment           General transfer comment: Assist to rise, steady, control descent. Multimodal cueing required.  Increased time.    Ambulation/Gait Ambulation/Gait assistance: Min assist, +2 safety/equipment Gait Distance (Feet): 50 Feet Assistive device: Rolling walker (2 wheels) Gait Pattern/deviations: Decreased step length - right, Decreased step length - left, Decreased stride length       General Gait Details: Assist to stabilize pt and mange RW throughout distance. Multimodal cueing required. Remained on 2l [place] O2-sats 95%.   Stairs             Wheelchair Mobility    Modified Rankin (Stroke Patients Only)       Balance Overall balance assessment: Needs assistance, History of Falls         Standing balance support: During functional activity, Reliant on assistive device for balance, Bilateral upper extremity supported Standing balance-Leahy Scale: Poor                              Cognition Arousal/Alertness: Awake/alert (drowsy) Behavior During Therapy: WFL for tasks assessed/performed Overall Cognitive Status: History of cognitive impairments - at baseline                                 General Comments: multimodal cueing required        Exercises      General Comments        Pertinent Vitals/Pain Pain Assessment Pain Assessment: Faces Faces Pain Scale: No hurt    Home Living                          Prior Function            PT Goals (current goals can  now be found in the care plan section) Progress towards PT goals: Progressing toward goals    Frequency    Min 3X/week      PT Plan Current plan remains appropriate    Co-evaluation              AM-PAC PT "6 Clicks" Mobility   Outcome Measure  Help needed turning from your back to your side while in a flat bed without using bedrails?: A Lot Help needed moving from lying on your back to sitting on the side of a flat bed without using bedrails?: A Lot Help needed moving to and from a bed to a chair (including a wheelchair)?: A Little Help  needed standing up from a chair using your arms (e.g., wheelchair or bedside chair)?: A Little Help needed to walk in hospital room?: A Lot Help needed climbing 3-5 steps with a railing? : Total 6 Click Score: 13    End of Session Equipment Utilized During Treatment: Gait belt;Oxygen Activity Tolerance: Patient limited by fatigue Patient left: in bed;with call bell/phone within reach;with family/visitor present;with bed alarm set   PT Visit Diagnosis: History of falling (Z91.81);Muscle weakness (generalized) (M62.81);Difficulty in walking, not elsewhere classified (R26.2);Unsteadiness on feet (R26.81)     Time: 3710-6269 PT Time Calculation (min) (ACUTE ONLY): 21 min  Charges:  $Gait Training: 8-22 mins                        Faye Ramsay, PT Acute Rehabilitation  Office: [phone] Pager: [phone]

## 2022-02-02 NOTE — Progress Notes (Signed)
PROGRESS NOTE    Ana Walsh  MEQ:[id number] DOB: 1933-03-14 DOA: 01/29/2022 PCP: System, Provider Not In    Brief Narrative:   Ana Walsh is a 88 y.o. female with past medical history significant for dementia, osteoarthritis who presented to [facility] ED on 7/1 from [facility] IDL with generalized weakness, decreased appetite and hallucinations with multiple falls.  Given her underlying dementia, daughter provides majority of history.  Over the last 2 weeks, with progressive weakness and difficulty getting out of the chair.  At baseline utilizes a walker in the memory care unit.  Recent ED visit with catatonic spell in which she was staring down and not responding to questions, MRI brain negative.  Zoloft was increased roughly 3 weeks prior.  In the ED, BP 159/84, temperature 98.4 F, HR 68, RR 16, SPO2 96% on 2 L nasal cannula.  WBC 11.7, hemoglobin 16.3, platelets 199.  Sodium 141, potassium 3.1, chloride 104, CO2 27, glucose 107, BUN 16, creatinine 0.74.  AST 29, ALT 23, total bilirubin 1.6.  Urinalysis with moderate leukocytes, positive nitrite, rare bacteria, 11-20 WBCs.  CT head without contrast with no acute findings but noted atrophy and chronic small vessel ischemic changes.  CT angiogram chest PE with single probable small pulmonary embolism left lower lobe, bibasilar atelectasis, marked enlargement of right thyroid lobe with mass effect upon the trachea; question mass versus goiter, follow-up ultrasound recommended, aortic atherosclerosis.  Curies was consulted for admission for progressive weakness, falls, pulmonary embolism, UTI.  Assessment & Plan:    Pulmonary embolism CT angiogram chest with single small pulmonary embolism left lower lobe.  Bilateral duplex ultrasound lower extremities negative for DVT.  Initially started on heparin drip and now transition to Eliquis. --Eliquis 10mg  PO BID x 7 days, followed by 5mg  PO daily  E. coli urinary tract infection Presenting with  confusion, likely secondary to UTI.  Urinalysis with positive nitrite, moderate leukocytes, 11-20 WBCs and urine culture positive for E. coli. --Ceftriaxone 1 g IV every 24 hours; plan 5 day course  Essential hypertension Not on antihypertensives outpatient. --Hydralazine 25 mg p.o. q8h PRN SBP >165 --Continue to monitor BP closely  Hypokalemia Potassium 3.4 this morning, will replete.  Dementia --Delirium precautions --Get up during the day --Encourage a familiar face to remain present throughout the day --Keep blinds open and lights on during daylight hours --Minimize the use of opioids/benzodiazepines --Exelon patch daily --Risperdal 2 mg PO BID  Anxiety/depression --Zoloft 125 mg p.o. daily  Osteoarthritis --Prednisone  Generalized weakness/deconditioning: --PT/OT recommend SNF placement, TOC for placement  DVT prophylaxis: Place TED hose Start: 01/29/22 2000 apixaban (ELIQUIS) tablet 10 mg  apixaban (ELIQUIS) tablet 5 mg    Code Status: DNR Family Communication: No family present at bedside this morning  Disposition Plan:  Level of care: Med-Surg Status is: Inpatient Remains inpatient appropriate because: IV antibiotics, pending SNF placement    Consultants:  None  Procedures:  None  Antimicrobials:  Ceftriaxone 7/2>>   Subjective: Patient seen examined bedside, resting comfortably.  Lying in bed.  No family present.  Pleasantly confused.  Denies headache, no chest pain, no shortness of breath, no abdominal pain.  No acute concerns overnight per nursing staff.  Objective: Vitals:   02/01/22 0559 02/01/22 1409 02/01/22 2024 02/02/22 0557  BP: (!) 129/52 (!) 120/53 (!) 155/65 (!) 180/61  Pulse: 63 64 71 68  Resp: 16 17 19 16   Temp: 98.7 F (37.1 C) 98.1 F (36.7 C) 98.6 F (37 C) 98.1 F (  36.7 C)  TempSrc: Oral Oral Oral Oral  SpO2:  94% 93% 90%  Weight:      Height:        Intake/Output Summary (Last 24 hours) at 02/02/2022 1253 Last data  filed at 02/02/2022 0558 Gross per 24 hour  Intake 1509.17 ml  Output 700 ml  Net 809.17 ml   Filed Weights   01/29/22 1049 01/29/22 1832  Weight: 77.1 kg 66.7 kg    Examination:  Physical Exam: GEN: NAD, alert, pleasantly confused, (Home/2023/Biden), elderly in appearance HEENT: NCAT, PERRL, EOMI, sclera clear, MMM PULM: CTAB w/o wheezes/crackles, normal respiratory effort, on room air CV: RRR w/o M/G/R GI: abd soft, NTND, NABS, no R/G/M MSK: no peripheral edema, moves all extremities independently NEURO: CN II-XII intact, no focal deficits PSYCH: normal mood/affect Integumentary: dry/intact, no rashes or wounds    Data Reviewed: I have personally reviewed following labs and imaging studies  CBC: Recent Labs  Lab 01/29/22 1221 01/30/22 0707 01/31/22 1612 02/01/22 0543  WBC 11.7* 9.5 9.9 9.2  NEUTROABS 10.2*  --  7.3 6.6  HGB 16.3* 15.9* 17.0* 16.5*  HCT 48.7* 48.5* 52.5* 51.2*  MCV 96.2 98.0 99.1 98.7  PLT 199 191 193 208   Basic Metabolic Panel: Recent Labs  Lab 01/29/22 1221 01/30/22 0707 01/31/22 1612 02/01/22 0543 02/02/22 0538  NA 141 141 141 142 139  K 3.1* 3.3* 3.3* 3.4* 3.4*  CL 104 105 104 107 105  CO2 27 30 28 27 26   GLUCOSE 107* 95 104* 96 101*  BUN 16 13 12 11 9   CREATININE 0.74 0.66 0.88 0.61 0.60  CALCIUM 9.5 8.8* 8.9 8.7* 8.9   GFR: Estimated Creatinine Clearance: 45.4 mL/min (by C-G formula based on SCr of 0.6 mg/dL). Liver Function Tests: Recent Labs  Lab 01/29/22 1221 01/30/22 0707 02/01/22 0543  AST 29 18 14*  ALT 23 18 17   ALKPHOS 62 57 58  BILITOT 1.6* 1.0 1.0  PROT 6.8 6.5 6.4*  ALBUMIN 3.8 3.5 3.3*   No results for input(s): "LIPASE", "AMYLASE" in the last 168 hours. No results for input(s): "AMMONIA" in the last 168 hours. Coagulation Profile: No results for input(s): "INR", "PROTIME" in the last 168 hours. Cardiac Enzymes: No results for input(s): "CKTOTAL", "CKMB", "CKMBINDEX", "TROPONINI" in the last 168  hours. BNP (last 3 results) No results for input(s): "PROBNP" in the last 8760 hours. HbA1C: No results for input(s): "HGBA1C" in the last 72 hours. CBG: Recent Labs  Lab 01/29/22 1053  GLUCAP 103*   Lipid Profile: No results for input(s): "CHOL", "HDL", "LDLCALC", "TRIG", "CHOLHDL", "LDLDIRECT" in the last 72 hours. Thyroid Function Tests: No results for input(s): "TSH", "T4TOTAL", "FREET4", "T3FREE", "THYROIDAB" in the last 72 hours. Anemia Panel: No results for input(s): "VITAMINB12", "FOLATE", "FERRITIN", "TIBC", "IRON", "RETICCTPCT" in the last 72 hours. Sepsis Labs: No results for input(s): "PROCALCITON", "LATICACIDVEN" in the last 168 hours.  Recent Results (from the past 240 hour(s))  Resp panel by RT-PCR (RSV, Flu A&B, Covid) Anterior Nasal Swab     Status: None   Collection Time: 01/29/22  1:54 PM   Specimen: Anterior Nasal Swab  Result Value Ref Range Status   SARS Coronavirus 2 by RT PCR NEGATIVE NEGATIVE Final    Comment: (NOTE) SARS-CoV-2 target nucleic acids are NOT DETECTED.  The SARS-CoV-2 RNA is generally detectable in upper respiratory specimens during the acute phase of infection. The lowest concentration of SARS-CoV-2 viral copies this assay can detect is 138 copies/mL. A negative  result does not preclude SARS-Cov-2 infection and should not be used as the sole basis for treatment or other patient management decisions. A negative result may occur with  improper specimen collection/handling, submission of specimen other than nasopharyngeal swab, presence of viral mutation(s) within the areas targeted by this assay, and inadequate number of viral copies(<138 copies/mL). A negative result must be combined with clinical observations, patient history, and epidemiological information. The expected result is Negative.  Fact Sheet for Patients:  BloggerCourse.com  Fact Sheet for Healthcare Providers:   SeriousBroker.it  This test is no t yet approved or cleared by the [place] FDA and  has been authorized for detection and/or diagnosis of SARS-CoV-2 by FDA under an Emergency Use Authorization (EUA). This EUA will remain  in effect (meaning this test can be used) for the duration of the COVID-19 declaration under Section 564(b)(1) of the Act, 21 U.S.C.section 360bbb-3(b)(1), unless the authorization is terminated  or revoked sooner.       Influenza A by PCR NEGATIVE NEGATIVE Final   Influenza B by PCR NEGATIVE NEGATIVE Final    Comment: (NOTE) The Xpert Xpress SARS-CoV-2/FLU/RSV plus assay is intended as an aid in the diagnosis of influenza from Nasopharyngeal swab specimens and should not be used as a sole basis for treatment. Nasal washings and aspirates are unacceptable for Xpert Xpress SARS-CoV-2/FLU/RSV testing.  Fact Sheet for Patients: BloggerCourse.com  Fact Sheet for Healthcare Providers: SeriousBroker.it  This test is not yet approved or cleared by the [place] FDA and has been authorized for detection and/or diagnosis of SARS-CoV-2 by FDA under an Emergency Use Authorization (EUA). This EUA will remain in effect (meaning this test can be used) for the duration of the COVID-19 declaration under Section 564(b)(1) of the Act, 21 U.S.C. section 360bbb-3(b)(1), unless the authorization is terminated or revoked.     Resp Syncytial Virus by PCR NEGATIVE NEGATIVE Final    Comment: (NOTE) Fact Sheet for Patients: BloggerCourse.com  Fact Sheet for Healthcare Providers: SeriousBroker.it  This test is not yet approved or cleared by the [place] FDA and has been authorized for detection and/or diagnosis of SARS-CoV-2 by FDA under an Emergency Use Authorization (EUA). This EUA will remain in effect (meaning this test can be used) for  the duration of the COVID-19 declaration under Section 564(b)(1) of the Act, 21 U.S.C. section 360bbb-3(b)(1), unless the authorization is terminated or revoked.  Performed at [facility], [address] [place], [place] [address]   MRSA Next Gen by PCR, Nasal     Status: None   Collection Time: 01/29/22  7:32 PM   Specimen: Nasal Mucosa; Nasal Swab  Result Value Ref Range Status   MRSA by PCR Next Gen NOT DETECTED NOT DETECTED Final    Comment: (NOTE) The GeneXpert MRSA Assay (FDA approved for NASAL specimens only), is one component of a comprehensive MRSA colonization surveillance program. It is not intended to diagnose MRSA infection nor to guide or monitor treatment for MRSA infections. Test performance is not FDA approved in patients less than 36 years old. Performed at [facility], [address], [place], [place] [address]   Urine Culture     Status: Abnormal   Collection Time: 01/31/22 11:44 AM   Specimen: Urine, Clean Catch  Result Value Ref Range Status   Specimen Description   Final    URINE, CLEAN CATCH Performed at [facility], [address], [place], [place] [address]    Special  Requests   Final    NONE Performed at [facility], [address] [place], [place] [address]    Culture 20,000 COLONIES/mL ESCHERICHIA COLI (A)  Final   Report Status 02/02/2022 FINAL  Final   Organism ID, Bacteria ESCHERICHIA COLI (A)  Final      Susceptibility   Escherichia coli - MIC*    AMPICILLIN 4 SENSITIVE Sensitive     CEFAZOLIN <=4 SENSITIVE Sensitive     CEFEPIME <=0.12 SENSITIVE Sensitive     CEFTRIAXONE <=0.25 SENSITIVE Sensitive     CIPROFLOXACIN <=0.25 SENSITIVE Sensitive     GENTAMICIN <=1 SENSITIVE Sensitive     IMIPENEM <=0.25 SENSITIVE Sensitive     NITROFURANTOIN <=16 SENSITIVE Sensitive     TRIMETH/SULFA <=20 SENSITIVE Sensitive     AMPICILLIN/SULBACTAM <=2 SENSITIVE Sensitive      PIP/TAZO <=4 SENSITIVE Sensitive     * 20,000 COLONIES/mL ESCHERICHIA COLI         Radiology Studies: CT HEAD WO CONTRAST ( )  Result Date: 01/31/2022 CLINICAL DATA:  Mental status change of unknown cause EXAM: CT HEAD WITHOUT CONTRAST TECHNIQUE: Contiguous axial images were obtained from the base of the skull through the vertex without intravenous contrast. RADIATION DOSE REDUCTION: This exam was performed according to the departmental dose-optimization program which includes automated exposure control, adjustment of the mA and/or kV according to patient size and/or use of iterative reconstruction technique. COMPARISON:  MRI 01/12/2022 FINDINGS: Brain: No acute CT finding. Age related atrophy. Chronic small-vessel ischemic changes of the cerebral hemispheric white matter, thalami and basal ganglia. No sign of acute infarction, mass lesion, hemorrhage, hydrocephalus or extra-axial collection. Vascular: There is atherosclerotic calcification of the major vessels at the base of the brain. Skull: Negative Sinuses/Orbits: Chronic right maxillary sinusitis. Other sinuses clear. Orbits negative. Other: None IMPRESSION: No acute CT finding. Atrophy. Chronic small-vessel ischemic changes. Chronic right maxillary sinusitis. Electronically Signed   By: Paulina Fusi M.D.   On: 01/31/2022 15:15        Scheduled Meds:  acetaminophen  500 mg Oral BID   apixaban  10 mg Oral BID   Followed by   Melene Muller ON 02-12-22] apixaban  5 mg Oral BID   potassium chloride  30 mEq Oral Q3H   predniSONE  10 mg Oral Q breakfast   risperiDONE  2 mg Oral BID   rivastigmine  4.6 mg Transdermal Daily   sertraline  125 mg Oral Daily   Continuous Infusions:  sodium chloride 75 mL/hr at 02/02/22 0124   sodium chloride Stopped (01/31/22 2019)   cefTRIAXone (ROCEPHIN)  IV 1 g (02/02/22 0919)     LOS: 2 days    Time spent: 43 minutes spent on chart review, discussion with nursing staff, consultants, updating family  and interview/physical exam; more than 50% of that time was spent in counseling and/or coordination of care.    Alvira Philips [place], DO [facility] Available via Epic secure chat 7am-7pm After these hours, please refer to coverage provider listed on [email or web address] 02/02/2022, 12:53 PM

## 2022-02-03 DIAGNOSIS — G9341 Metabolic encephalopathy: Secondary | ICD-10-CM | POA: Diagnosis not present

## 2022-02-03 DIAGNOSIS — I2699 Other pulmonary embolism without acute cor pulmonale: Secondary | ICD-10-CM | POA: Diagnosis not present

## 2022-02-03 DIAGNOSIS — F0390 Unspecified dementia without behavioral disturbance: Secondary | ICD-10-CM | POA: Diagnosis not present

## 2022-02-03 DIAGNOSIS — J9601 Acute respiratory failure with hypoxia: Secondary | ICD-10-CM | POA: Diagnosis not present

## 2022-02-03 LAB — MAGNESIUM: Magnesium: 2.2 mg/dL (ref 1.7–2.4)

## 2022-02-03 LAB — BASIC METABOLIC PANEL
Anion gap: 10 (ref 5–15)
BUN: 8 mg/dL (ref 8–23)
CO2: 25 mmol/L (ref 22–32)
Calcium: 8.9 mg/dL (ref 8.9–10.3)
Chloride: 102 mmol/L (ref 98–111)
Creatinine, Ser: 0.58 mg/dL (ref 0.44–1.00)
GFR, Estimated: >60 mL/min (ref >60)
Glucose, Bld: 113 mg/dL — ABNORMAL HIGH (ref 70–99)
Potassium: 3.3 mmol/L — ABNORMAL LOW (ref 3.5–5.1)
Sodium: 137 mmol/L (ref 135–145)

## 2022-02-03 MED ORDER — RISPERIDONE 2 MG PO TABS: 2.0000 mg | ORAL_TABLET | Freq: Two times a day (BID) | ORAL | 0 refills | Status: AC

## 2022-02-03 MED ORDER — APIXABAN (ELIQUIS) VTE STARTER PACK (10MG AND 5MG): ORAL_TABLET | ORAL | 0 refills | Status: AC

## 2022-02-03 MED ORDER — CEFADROXIL 500 MG PO CAPS
500.0000 mg | ORAL_CAPSULE | Freq: Two times a day (BID) | ORAL | 0 refills | Status: AC
Start: 2022-02-03 — End: 2022-02-05

## 2022-02-03 MED ORDER — LOPERAMIDE HCL 2 MG PO CAPS: 2.0000 mg | ORAL_CAPSULE | ORAL | 0 refills | Status: AC | PRN

## 2022-02-03 NOTE — Plan of Care (Signed)
  Problem: Education: Goal: Knowledge of General Education information will improve Description: Including pain rating scale, medication(s)/side effects and non-pharmacologic comfort measures Outcome: Completed/Met   Problem: Health Behavior/Discharge Planning: Goal: Ability to manage health-related needs will improve Outcome: Completed/Met   Problem: Clinical Measurements: Goal: Ability to maintain clinical measurements within normal limits will improve Outcome: Completed/Met Goal: Will remain free from infection Outcome: Completed/Met Goal: Diagnostic test results will improve Outcome: Completed/Met Goal: Respiratory complications will improve Outcome: Completed/Met Goal: Cardiovascular complication will be avoided Outcome: Completed/Met   Problem: Activity: Goal: Risk for activity intolerance will decrease Outcome: Completed/Met   Problem: Nutrition: Goal: Adequate nutrition will be maintained Outcome: Completed/Met   Problem: Coping: Goal: Level of anxiety will decrease Outcome: Completed/Met   Problem: Elimination: Goal: Will not experience complications related to bowel motility Outcome: Completed/Met Goal: Will not experience complications related to urinary retention Outcome: Completed/Met   Problem: Pain Managment: Goal: General experience of comfort will improve Outcome: Completed/Met   Problem: Safety: Goal: Ability to remain free from injury will improve Outcome: Completed/Met   Problem: Skin Integrity: Goal: Risk for impaired skin integrity will decrease Outcome: Completed/Met   Problem: Safety: Goal: Non-violent Restraint(s) Outcome: Completed/Met

## 2022-02-03 NOTE — Care Plan (Signed)
Patient discharged to [facility]. Family at bedside at time of transfer. Patient placed on oxygen at 2L/[place] due to sating at 93%.  BP 113/634 at time of discharge.

## 2022-02-03 NOTE — TOC Transition Note (Signed)
Transition of Care [facility]) - CM/SW Discharge Note   Patient Details  Name: Ana Walsh MRN: [id number] Date of Birth: 1932-11-03  [facility] CM/SW Contact:  Bartholome Bill, RN Phone Number: 02/03/2022, 1:09 PM   Clinical Narrative:     Spoke with Ana Walsh from [facility]. She states that after looking at the physical therapy note from yesterday she believes that pt would do best with going to short term SNF before returning. Spoke with daughter Ana Walsh to give SNF bed offers. [facility] chosen.  [facility] has bed available today and pt will transport via [facility]. Yellow DNR on the chart for transport. RN to call report to [phone].   Barriers to Discharge: Barriers Resolved   Patient Goals and CMS Choice Patient states their goals for this hospitalization and ongoing recovery are:: return home CMS Medicare.gov Compare Post Acute Care list provided to:: Patient Represenative (must comment) Choice offered to / list presented to : Adult Children  Discharge Placement                Patient to be transferred to facility by: [facility] Name of family member notified: Ana Walsh (Daughter)   [phone] Patient and family notified of of transfer: 01/30/22  Discharge Plan and Services In-house Referral: Clinical Social Work              DME Arranged: Oxygen DME Agency: Patsy Lager Date DME Agency Contacted: 01/30/22 Time DME Agency Contacted: [phone] Representative spoke with at DME Agency: Ashly            Social Determinants of Health (SDOH) Interventions     Readmission Risk Interventions     No data to display

## 2022-02-03 NOTE — Discharge Summary (Signed)
Physician Discharge Summary  Ana Walsh FXT:[id number] DOB: [birth year] DOA: 01/29/2022  PCP: System, Provider Not In  Admit date: 01/29/2022 Discharge date: 02/03/2022  Admitted From: [facility]  Disposition:  [facility]  Recommendations for Outpatient Follow-up:  Follow up with PCP in 1-2 weeks Started on Eliquis for pulmonary embolism Continue antibiotics with cefadroxil to complete course for E. coli UTI  Discharge Condition: Stable CODE STATUS: DNR Diet recommendation: Heart healthy diet  History of present illness:  Ana Walsh is a 88 y.o. female with past medical history significant for dementia, osteoarthritis who presented to [facility] ED on 7/1 from [facility] with generalized weakness, decreased appetite and hallucinations with multiple falls.  Given her underlying dementia, daughter provides majority of history.  Over the last 2 weeks, with progressive weakness and difficulty getting out of the chair.  At baseline utilizes a walker in the memory care unit.   Recent ED visit with catatonic spell in which she was staring down and not responding to questions, MRI brain negative.  Zoloft was increased roughly 3 weeks prior.   In the ED, BP 159/84, temperature 98.4 F, HR 68, RR 16, SPO2 96% on 2 L nasal cannula.  WBC 11.7, hemoglobin 16.3, platelets 199.  Sodium 141, potassium 3.1, chloride 104, CO2 27, glucose 107, BUN 16, creatinine 0.74.  AST 29, ALT 23, total bilirubin 1.6.  Urinalysis with moderate leukocytes, positive nitrite, rare bacteria, 11-20 WBCs.  CT head without contrast with no acute findings but noted atrophy and chronic small vessel ischemic changes.  CT angiogram chest PE with single probable small pulmonary embolism left lower lobe, bibasilar atelectasis, marked enlargement of right thyroid lobe with mass effect upon the trachea; question mass versus goiter, follow-up ultrasound recommended, aortic atherosclerosis.  Curies was consulted for  admission for progressive weakness, falls, pulmonary embolism, UTI.  Hospital course:  Pulmonary embolism CT angiogram chest with single small pulmonary embolism left lower lobe.  Bilateral duplex ultrasound lower extremities negative for DVT.  Initially started on heparin drip and now transitioned to Eliquis on 02/02/2022. Eliquis 10mg  PO BID x 7 days, followed by 5mg  PO daily   E. coli urinary tract infection Presenting with confusion, likely secondary to UTI.  Urinalysis with positive nitrite, moderate leukocytes, 11-20 WBCs and urine culture positive for E. coli.  Continue antibiotics with cefadroxil to complete antibiotic course x5 days.   Essential hypertension Not on antihypertensives outpatient. Continue to monitor BP closely   Hypokalemia Repleted during hospitalization.   Dementia Delirium precautions, encourage to get up during the day with a familiar face to remain present throughout the day. Keep blinds open and lights on during daylight hours. Minimize the use of opioids/benzodiazepines. Exelon patch daily, Risperdal 2 mg PO BID   Anxiety/depression Zoloft 125 mg p.o. daily   Osteoarthritis Continue home Prednisone 10mg  PO daily   Generalized weakness/deconditioning: Discharging to SNF for further rehabilitation.   Discharge Diagnoses:  Principal Problem:   Acute pulmonary embolism (HCC) Active Problems:   Failure to thrive in adult   Acute hypoxemic respiratory failure (HCC)   Dementia (HCC)   Acute metabolic encephalopathy   E-coli UTI    Discharge Instructions  Discharge Instructions     Call MD for:  difficulty breathing, headache or visual disturbances   Complete by: As directed    Call MD for:  difficulty breathing, headache or visual disturbances   Complete by: As directed    Call MD for:  extreme fatigue  Complete by: As directed    Call MD for:  persistant dizziness or light-headedness   Complete by: As directed    Call MD for:  persistant  nausea and vomiting   Complete by: As directed    Call MD for:  severe uncontrolled pain   Complete by: As directed    Call MD for:  temperature >100.4   Complete by: As directed    Diet - low sodium heart healthy   Complete by: As directed    Increase activity slowly   Complete by: As directed    Increase activity slowly   Complete by: As directed       Allergies as of 02/03/2022       Reactions   Dicyclomine Anaphylaxis, Swelling   Swelling of tongue   Tamoxifen Anaphylaxis   Codeine Nausea And Vomiting   Doxycycline    unk   Penicillins    unk   Tramadol Other (See Comments)   Drowsiness   Cephalexin Diarrhea   Gabapentin Other (See Comments)   confusion        Medication List     TAKE these medications    acetaminophen 500 MG tablet Commonly known as: TYLENOL Take 500 mg by mouth in the morning and at bedtime.   Align 4 MG Caps Take 4 mg by mouth daily.   Apixaban Starter Pack (  and ) Commonly known as: ELIQUIS STARTER PACK Take as directed on package: start with two-5mg  tablets twice daily for 7 days. On day 8, switch to one-5mg  tablet twice daily.   Calcium 600+D Plus Minerals 600-400 MG-UNIT Tabs Take 1 tablet by mouth in the morning and at bedtime.   cefadroxil 500 MG capsule Commonly known as: DURICEF Take 1 capsule (500 mg total) by mouth 2 (two) times daily for 2 days.   loperamide 2 MG capsule Commonly known as: IMODIUM Take 1 capsule (2 mg total) by mouth as needed for diarrhea or loose stools. What changed:  when to take this reasons to take this   predniSONE 10 MG tablet Commonly known as: DELTASONE Take 10 mg by mouth daily with breakfast.   psyllium 0.52 g capsule Commonly known as: REGULOID Take 0.52 g by mouth in the morning and at bedtime.   risperiDONE 2 MG tablet Commonly known as: RISPERDAL Take 1 tablet (2 mg total) by mouth 2 (two) times daily. What changed: Another medication with the same name was removed.  Continue taking this medication, and follow the directions you see here.   rivastigmine 4.6 mg/24hr Commonly known as: EXELON Place 4.6 mg onto the skin daily.   sertraline 100 MG tablet Commonly known as: ZOLOFT Take 100 mg by mouth daily. Take along with 25 mg tab to equal 125 mg   sertraline 25 MG tablet Commonly known as: ZOLOFT Take 25 mg by mouth daily. Take along with 100 mg tab to equal 125 mg               Durable Medical Equipment  (From admission, onward)           Start     Ordered   01/30/22 1514  For home use only DME oxygen  Once       Question Answer Comment  Length of Need 6 Months   Mode or (Route) Nasal cannula   Frequency Continuous (stationary and portable oxygen unit needed)   Oxygen delivery system Gas      01/30/22 1514  Allergies  Allergen Reactions   Dicyclomine Anaphylaxis and Swelling    Swelling of tongue   Tamoxifen Anaphylaxis   Codeine Nausea And Vomiting   Doxycycline     unk   Penicillins     unk   Tramadol Other (See Comments)    Drowsiness   Cephalexin Diarrhea   Gabapentin Other (See Comments)    confusion    Consultations: None   Procedures/Studies: CT HEAD WO CONTRAST ( )  Result Date: 01/31/2022 CLINICAL DATA:  Mental status change of unknown cause EXAM: CT HEAD WITHOUT CONTRAST TECHNIQUE: Contiguous axial images were obtained from the base of the skull through the vertex without intravenous contrast. RADIATION DOSE REDUCTION: This exam was performed according to the departmental dose-optimization program which includes automated exposure control, adjustment of the mA and/or kV according to patient size and/or use of iterative reconstruction technique. COMPARISON:  MRI 01/12/2022 FINDINGS: Brain: No acute CT finding. Age related atrophy. Chronic small-vessel ischemic changes of the cerebral hemispheric white matter, thalami and basal ganglia. No sign of acute infarction, mass lesion, hemorrhage,  hydrocephalus or extra-axial collection. Vascular: There is atherosclerotic calcification of the major vessels at the base of the brain. Skull: Negative Sinuses/Orbits: Chronic right maxillary sinusitis. Other sinuses clear. Orbits negative. Other: None IMPRESSION: No acute CT finding. Atrophy. Chronic small-vessel ischemic changes. Chronic right maxillary sinusitis. Electronically Signed   By: Paulina Fusi M.D.   On: 01/31/2022 15:15   VAS [place] LOWER EXTREMITY VENOUS (DVT)  Result Date: 01/30/2022  Lower Venous DVT Study Patient Name:  LAJUANA PATCHELL  Date of Exam:   01/30/2022 Medical Rec #: [id number]       Accession #:    [id number] Date of Birth: 04-24-1933      Patient Gender: F Patient Age:   15 years Exam Location:  [facility] Procedure:      VAS [place] LOWER EXTREMITY VENOUS (DVT) Referring Phys: Charlane Ferretti --------------------------------------------------------------------------------  Indications: Pulmonary embolism.  Risk Factors: Confirmed PE. Limitations: Poor ultrasound/tissue interface. Comparison Study: No prior studies. Performing Technologist: Chanda Busing RVT  Examination Guidelines: A complete evaluation includes B-mode imaging, spectral Doppler, color Doppler, and power Doppler as needed of all accessible portions of each vessel. Bilateral testing is considered an integral part of a complete examination. Limited examinations for reoccurring indications may be performed as noted. The reflux portion of the exam is performed with the patient in reverse Trendelenburg.  +---------+---------------+---------+-----------+----------+--------------+ RIGHT    CompressibilityPhasicitySpontaneityPropertiesThrombus Aging +---------+---------------+---------+-----------+----------+--------------+ CFV      Full           Yes      Yes                                 +---------+---------------+---------+-----------+----------+--------------+ SFJ      Full                                                         +---------+---------------+---------+-----------+----------+--------------+ FV Prox  Full                                                        +---------+---------------+---------+-----------+----------+--------------+  FV Mid   Full                                                        +---------+---------------+---------+-----------+----------+--------------+ FV DistalFull                                                        +---------+---------------+---------+-----------+----------+--------------+ PFV      Full                                                        +---------+---------------+---------+-----------+----------+--------------+ POP      Full           Yes      Yes                                 +---------+---------------+---------+-----------+----------+--------------+ PTV      Full                                                        +---------+---------------+---------+-----------+----------+--------------+ PERO     Full                                                        +---------+---------------+---------+-----------+----------+--------------+   +---------+---------------+---------+-----------+----------+--------------+ LEFT     CompressibilityPhasicitySpontaneityPropertiesThrombus Aging +---------+---------------+---------+-----------+----------+--------------+ CFV      Full           Yes      Yes                                 +---------+---------------+---------+-----------+----------+--------------+ SFJ      Full                                                        +---------+---------------+---------+-----------+----------+--------------+ FV Prox  Full                                                        +---------+---------------+---------+-----------+----------+--------------+ FV Mid   Full                                                         +---------+---------------+---------+-----------+----------+--------------+  FV DistalFull                                                        +---------+---------------+---------+-----------+----------+--------------+ PFV      Full                                                        +---------+---------------+---------+-----------+----------+--------------+ POP      Full           Yes      Yes                                 +---------+---------------+---------+-----------+----------+--------------+ PTV      Full                                                        +---------+---------------+---------+-----------+----------+--------------+ PERO     Full                                                        +---------+---------------+---------+-----------+----------+--------------+     Summary: RIGHT: - There is no evidence of deep vein thrombosis in the lower extremity.  - No cystic structure found in the popliteal fossa.  LEFT: - There is no evidence of deep vein thrombosis in the lower extremity.  - No cystic structure found in the popliteal fossa.  *See table(s) above for measurements and observations. Electronically signed by Heath Lark on 01/30/2022 at 12:22:30 PM.    Final    CT Angio Chest PE W and/or Wo Contrast  Result Date: 01/29/2022 CLINICAL DATA:  High clinical suspicion of pulmonary embolism, weakness, hallucinations EXAM: CT ANGIOGRAPHY CHEST WITH CONTRAST TECHNIQUE: Multidetector CT imaging of the chest was performed using the standard protocol during bolus administration of intravenous contrast. Multiplanar CT image reconstructions and MIPs were obtained to evaluate the vascular anatomy. RADIATION DOSE REDUCTION: This exam was performed according to the departmental dose-optimization program which includes automated exposure control, adjustment of the mA and/or kV according to patient size and/or use of iterative reconstruction technique. CONTRAST:   45mL OMNIPAQUE IOHEXOL 350 MG/ML SOLN IV COMPARISON:  None available FINDINGS: Cardiovascular: Atherosclerotic calcifications aorta without aneurysm or dissection. Heart unremarkable. No pericardial effusion. Pulmonary arteries adequately opacified. Small probable small filling defect identified within a LEFT lower lobe pulmonary artery branch, likely a small pulmonary embolus. No additional pulmonary emboli. Mediastinum/Nodes: Marked enlargement of RIGHT thyroid lobe with mass effect upon the RIGHT lateral aspect of the trachea, question goiter versus mass. Esophagus unremarkable. No adenopathy. Lungs/Pleura: Dependent atelectasis lower lobes. No pulmonary infiltrate, pleural effusion, or pneumothorax. Upper Abdomen: Visualized upper abdomen unremarkable Musculoskeletal: Osseous structures demineralized with scattered degenerative changes thoracic spine. Review of the MIP images confirms the above findings. IMPRESSION: Single probable small pulmonary  embolus in a LEFT lower lobe pulmonary arterial branch as above. Bibasilar atelectasis. Marked enlargement of RIGHT thyroid lobe with mass effect upon trachea, question mass versus goiter; follow-up thyroid ultrasound evaluation recommended. Aortic Atherosclerosis (ICD10-I70.0). Critical Value/emergent results were called by telephone at the time of interpretation on 01/29/2022 at 4:55 pm to provider [facility]AT[facility] , who verbally acknowledged these results. Electronically Signed   By: Ulyses SouthwardMark  Boles M.D.   On: 01/29/2022 16:55   DG Chest Port 1 View  Result Date: 01/29/2022 CLINICAL DATA:  Hypoxia. Generalized weakness with decreased appetite and hallucinations. EXAM: PORTABLE CHEST 1 VIEW COMPARISON:  Radiographs 01/11/2022. FINDINGS: 1228 hours. Improved aeration of the lung bases. The heart size and mediastinal contours are stable with mild aortic atherosclerosis. The lungs are clear. There is no pleural effusion or pneumothorax. Old rib fractures are noted  bilaterally, and there are glenohumeral degenerative changes bilaterally. Postsurgical changes are present lower cervical spine and left breast. Telemetry leads overlie the chest. IMPRESSION: No evidence of active cardiopulmonary process. Electronically Signed   By: Carey BullocksWilliam  Veazey M.D.   On: 01/29/2022 13:10   MR BRAIN WO CONTRAST  Result Date: 01/12/2022 CLINICAL DATA:  Initial evaluation for acute neuro deficit, stroke suspected. EXAM: MRI HEAD WITHOUT CONTRAST TECHNIQUE: Multiplanar, multiecho pulse sequences of the brain and surrounding structures were obtained without intravenous contrast. COMPARISON:  Prior study from 03/14/2020. FINDINGS: Brain: Generalized age-related cerebral atrophy. Patchy and confluent T2/FLAIR hyperintensity involving the periventricular and deep white matter both cerebral hemispheres as well as the pons, most consistent with chronic small vessel ischemic disease, moderately advanced in nature. No evidence for acute or subacute ischemia. Gray-white matter differentiation maintained. No areas of chronic cortical infarction. No acute or chronic intracranial blood products. No mass lesion, midline shift or mass effect no hydrocephalus or extra-axial fluid collection. Pituitary gland suprasellar region normal. Vascular: Major intracranial vascular flow voids are maintained. Skull and upper cervical spine: Craniocervical junction normal. Bone marrow signal intensity within normal limits. No scalp soft tissue abnormality. Sinuses/Orbits: Prior bilateral ocular lens replacement. Moderate right maxillary sinusitis noted. Additional scattered mucosal thickening noted within the ethmoidal air cells. No mastoid effusion. Other: None. IMPRESSION: 1. No acute intracranial abnormality. 2. Generalized age-related cerebral atrophy with moderately advanced chronic microvascular ischemic disease. 3. Chronic right maxillary sinusitis. Electronically Signed   By: Rise MuBenjamin  McClintock M.D.   On:  01/12/2022 02:12   DG Skull 1-3 Views  Result Date: 01/11/2022 CLINICAL DATA:  MRI clearance EXAM: CHEST  1 VIEW; SKULL - 1-3 VIEW; ABDOMEN - 1 VIEW COMPARISON:  CT 02/22/2021 FINDINGS: Abdomen: Multiple support leads over the abdomen. Metallic snaps over the left mid abdomen. Nonobstructed gas pattern with moderate stool. Right hip replacement. Probable metallic zipper artifact over the left hip. Chest: Cardiomegaly. Mild diffuse reticular interstitial opacities suggesting chronic lung disease. No consolidation or effusion. No pneumothorax. Partially visualized hardware in the cervical spine. Metallic clips over the left chest. Old appearing right rib fractures. Skull: Metallic dental implants.  No fracture. IMPRESSION: 1. Right hip replacement. Presumed metallic zipper artifact over the left hip. Otherwise no unexpected metallic density foreign bodies over the abdomen. 2. No unexpected metallic foreign bodies over the chest 3. Dental implants. No unexpected metallic foreign bodies over the skull. Electronically Signed   By: Jasmine PangKim  Fujinaga M.D.   On: 01/11/2022 22:59   DG Chest 1 View  Result Date: 01/11/2022 CLINICAL DATA:  MRI clearance EXAM: CHEST  1 VIEW; SKULL - 1-3 VIEW; ABDOMEN -  1 VIEW COMPARISON:  CT 02/22/2021 FINDINGS: Abdomen: Multiple support leads over the abdomen. Metallic snaps over the left mid abdomen. Nonobstructed gas pattern with moderate stool. Right hip replacement. Probable metallic zipper artifact over the left hip. Chest: Cardiomegaly. Mild diffuse reticular interstitial opacities suggesting chronic lung disease. No consolidation or effusion. No pneumothorax. Partially visualized hardware in the cervical spine. Metallic clips over the left chest. Old appearing right rib fractures. Skull: Metallic dental implants.  No fracture. IMPRESSION: 1. Right hip replacement. Presumed metallic zipper artifact over the left hip. Otherwise no unexpected metallic density foreign bodies over the  abdomen. 2. No unexpected metallic foreign bodies over the chest 3. Dental implants. No unexpected metallic foreign bodies over the skull. Electronically Signed   By: Jasmine Pang M.D.   On: 01/11/2022 22:59   DG Abdomen 1 View  Result Date: 01/11/2022 CLINICAL DATA:  MRI clearance EXAM: CHEST  1 VIEW; SKULL - 1-3 VIEW; ABDOMEN - 1 VIEW COMPARISON:  CT 02/22/2021 FINDINGS: Abdomen: Multiple support leads over the abdomen. Metallic snaps over the left mid abdomen. Nonobstructed gas pattern with moderate stool. Right hip replacement. Probable metallic zipper artifact over the left hip. Chest: Cardiomegaly. Mild diffuse reticular interstitial opacities suggesting chronic lung disease. No consolidation or effusion. No pneumothorax. Partially visualized hardware in the cervical spine. Metallic clips over the left chest. Old appearing right rib fractures. Skull: Metallic dental implants.  No fracture. IMPRESSION: 1. Right hip replacement. Presumed metallic zipper artifact over the left hip. Otherwise no unexpected metallic density foreign bodies over the abdomen. 2. No unexpected metallic foreign bodies over the chest 3. Dental implants. No unexpected metallic foreign bodies over the skull. Electronically Signed   By: Jasmine Pang M.D.   On: 01/11/2022 22:59     Subjective: Patient seen examined bedside, resting comfortably.  Lying in bed.  No specific complaints this morning, pleasantly confused.  Discharging to SNF for further rehabilitation today.  Denies headache, no chest pain, no shortness of breath, no abdominal pain.  No acute events overnight per nursing staff.  Discharge Exam: Vitals:   02/02/22 2233 02/03/22 0606  BP: 121/73 134/67  Pulse: 80 77  Resp:  20  Temp: 97.8 F (36.6 C) (!) 97.4 F (36.3 C)  SpO2: 90% 99%   Vitals:   02/02/22 1358 02/02/22 2233 02/03/22 0606 02/03/22 0647  BP: (!) 143/77 121/73 134/67   Pulse: 76 80 77   Resp: 16  20   Temp: 98.4 F (36.9 C) 97.8 F (36.6  C) (!) 97.4 F (36.3 C)   TempSrc:  Oral Oral   SpO2: 94% 90% 99%   Weight:    62.9 kg  Height:        Physical Exam: GEN: NAD, alert, pleasantly confused, elderly in appearance HEENT: NCAT, PERRL, EOMI, sclera clear, MMM PULM: CTAB w/o wheezes/crackles, normal respiratory effort, on room air CV: RRR w/o M/G/R GI: abd soft, NTND, NABS, no R/G/M MSK: no peripheral edema, moves all extremities independently NEURO: CN II-XII intact, no focal deficits PSYCH: normal mood/affect Integumentary: dry/intact, no rashes or wounds    The results of significant diagnostics from this hospitalization (including imaging, microbiology, ancillary and laboratory) are listed below for reference.     Microbiology: Recent Results (from the past 240 hour(s))  Resp panel by RT-PCR (RSV, Flu A&B, Covid) Anterior Nasal Swab     Status: None   Collection Time: 01/29/22  1:54 PM   Specimen: Anterior Nasal Swab  Result Value Ref Range  Status   SARS Coronavirus 2 by RT PCR NEGATIVE NEGATIVE Final    Comment: (NOTE) SARS-CoV-2 target nucleic acids are NOT DETECTED.  The SARS-CoV-2 RNA is generally detectable in upper respiratory specimens during the acute phase of infection. The lowest concentration of SARS-CoV-2 viral copies this assay can detect is 138 copies/mL. A negative result does not preclude SARS-Cov-2 infection and should not be used as the sole basis for treatment or other patient management decisions. A negative result may occur with  improper specimen collection/handling, submission of specimen other than nasopharyngeal swab, presence of viral mutation(s) within the areas targeted by this assay, and inadequate number of viral copies(<138 copies/mL). A negative result must be combined with clinical observations, patient history, and epidemiological information. The expected result is Negative.  Fact Sheet for Patients:  BloggerCourse.com  Fact Sheet for  Healthcare Providers:  SeriousBroker.it  This test is no t yet approved or cleared by the [place] FDA and  has been authorized for detection and/or diagnosis of SARS-CoV-2 by FDA under an Emergency Use Authorization (EUA). This EUA will remain  in effect (meaning this test can be used) for the duration of the COVID-19 declaration under Section 564(b)(1) of the Act, 21 U.S.C.section 360bbb-3(b)(1), unless the authorization is terminated  or revoked sooner.       Influenza A by PCR NEGATIVE NEGATIVE Final   Influenza B by PCR NEGATIVE NEGATIVE Final    Comment: (NOTE) The Xpert Xpress SARS-CoV-2/FLU/RSV plus assay is intended as an aid in the diagnosis of influenza from Nasopharyngeal swab specimens and should not be used as a sole basis for treatment. Nasal washings and aspirates are unacceptable for Xpert Xpress SARS-CoV-2/FLU/RSV testing.  Fact Sheet for Patients: BloggerCourse.com  Fact Sheet for Healthcare Providers: SeriousBroker.it  This test is not yet approved or cleared by the [place] FDA and has been authorized for detection and/or diagnosis of SARS-CoV-2 by FDA under an Emergency Use Authorization (EUA). This EUA will remain in effect (meaning this test can be used) for the duration of the COVID-19 declaration under Section 564(b)(1) of the Act, 21 U.S.C. section 360bbb-3(b)(1), unless the authorization is terminated or revoked.     Resp Syncytial Virus by PCR NEGATIVE NEGATIVE Final    Comment: (NOTE) Fact Sheet for Patients: BloggerCourse.com  Fact Sheet for Healthcare Providers: SeriousBroker.it  This test is not yet approved or cleared by the [place] FDA and has been authorized for detection and/or diagnosis of SARS-CoV-2 by FDA under an Emergency Use Authorization (EUA). This EUA will remain in effect (meaning this  test can be used) for the duration of the COVID-19 declaration under Section 564(b)(1) of the Act, 21 U.S.C. section 360bbb-3(b)(1), unless the authorization is terminated or revoked.  Performed at [facility], [address] [place], [place] [address]   MRSA Next Gen by PCR, Nasal     Status: None   Collection Time: 01/29/22  7:32 PM   Specimen: Nasal Mucosa; Nasal Swab  Result Value Ref Range Status   MRSA by PCR Next Gen NOT DETECTED NOT DETECTED Final    Comment: (NOTE) The GeneXpert MRSA Assay (FDA approved for NASAL specimens only), is one component of a comprehensive MRSA colonization surveillance program. It is not intended to diagnose MRSA infection nor to guide or monitor treatment for MRSA infections. Test performance is not FDA approved in patients less than 37 years old. Performed at [facility], [address] [place], [place] [address]  Urine Culture     Status: Abnormal   Collection Time: 01/31/22 11:44 AM   Specimen: Urine, Clean Catch  Result Value Ref Range Status   Specimen Description   Final    URINE, CLEAN CATCH Performed at [facility], [address] [place], [place] [address]    Special Requests   Final    NONE Performed at [facility], [address] [place], [place] [address]    Culture 20,000 COLONIES/mL ESCHERICHIA COLI (A)  Final   Report Status 02/02/2022 FINAL  Final   Organism ID, Bacteria ESCHERICHIA COLI (A)  Final      Susceptibility   Escherichia coli - MIC*    AMPICILLIN 4 SENSITIVE Sensitive     CEFAZOLIN <=4 SENSITIVE Sensitive     CEFEPIME <=0.12 SENSITIVE Sensitive     CEFTRIAXONE <=0.25 SENSITIVE Sensitive     CIPROFLOXACIN <=0.25 SENSITIVE Sensitive     GENTAMICIN <=1 SENSITIVE Sensitive     IMIPENEM <=0.25 SENSITIVE Sensitive     NITROFURANTOIN <=16 SENSITIVE Sensitive     TRIMETH/SULFA <=20 SENSITIVE Sensitive     AMPICILLIN/SULBACTAM <=2  SENSITIVE Sensitive     PIP/TAZO <=4 SENSITIVE Sensitive     * 20,000 COLONIES/mL ESCHERICHIA COLI     Labs: BNP (last 3 results) Recent Labs    01/29/22 1223  BNP 63.5   Basic Metabolic Panel: Recent Labs  Lab 01/30/22 0707 01/31/22 1612 02/01/22 0543 02/02/22 0538 02/03/22 0645  NA 141 141 142 139 137  K 3.3* 3.3* 3.4* 3.4* 3.3*  CL 105 104 107 105 102  CO2 GLUCOSE 95 104* 96 101* 113*  BUN CREATININE 0.66 0.88 0.61 0.60 0.58  CALCIUM 8.8* 8.9 8.7* 8.9 8.9  MG  --   --   --   --  2.2   Liver Function Tests: Recent Labs  Lab 01/29/22 1221 01/30/22 0707 02/01/22 0543  AST 29 18 14*  ALT ALKPHOS 62 57 58  BILITOT 1.6* 1.0 1.0  PROT 6.8 6.5 6.4*  ALBUMIN 3.8 3.5 3.3*   No results for input(s): "LIPASE", "AMYLASE" in the last 168 hours. No results for input(s): "AMMONIA" in the last 168 hours. CBC: Recent Labs  Lab 01/29/22 1221 01/30/22 0707 01/31/22 1612 02/01/22 0543  WBC 11.7* 9.5 9.9 9.2  NEUTROABS 10.2*  --  7.3 6.6  HGB 16.3* 15.9* 17.0* 16.5*  HCT 48.7* 48.5* 52.5* 51.2*  MCV 96.2 98.0 99.1 98.7  PLT 199 191 193 208   Cardiac Enzymes: No results for input(s): "CKTOTAL", "CKMB", "CKMBINDEX", "TROPONINI" in the last 168 hours. BNP: Invalid input(s): "POCBNP" CBG: Recent Labs  Lab 01/29/22 1053  GLUCAP 103*   D-Dimer No results for input(s): "DDIMER" in the last 72 hours. Hgb A1c No results for input(s): "HGBA1C" in the last 72 hours. Lipid Profile No results for input(s): "CHOL", "HDL", "LDLCALC", "TRIG", "CHOLHDL", "LDLDIRECT" in the last 72 hours. Thyroid function studies No results for input(s): "TSH", "T4TOTAL", "T3FREE", "THYROIDAB" in the last 72 hours.  Invalid input(s): "FREET3" Anemia work up No results for input(s): "VITAMINB12", "FOLATE", "FERRITIN", "TIBC", "IRON", "RETICCTPCT" in the last 72 hours. Urinalysis    Component Value Date/Time   COLORURINE YELLOW 01/29/2022 1500    APPEARANCEUR HAZY (A) 01/29/2022 1500   LABSPEC 1.019 01/29/2022 1500   PHURINE 5.0 01/29/2022 1500   GLUCOSEU NEGATIVE 01/29/2022 1500   HGBUR SMALL (A) 01/29/2022  1500   BILIRUBINUR NEGATIVE 01/29/2022 1500   KETONESUR NEGATIVE 01/29/2022 1500   PROTEINUR NEGATIVE 01/29/2022 1500   NITRITE POSITIVE (A) 01/29/2022 1500   LEUKOCYTESUR MODERATE (A) 01/29/2022 1500   Sepsis Labs Recent Labs  Lab 01/29/22 1221 01/30/22 0707 01/31/22 1612 02/01/22 0543  WBC 11.7* 9.5 9.9 9.2   Microbiology Recent Results (from the past 240 hour(s))  Resp panel by RT-PCR (RSV, Flu A&B, Covid) Anterior Nasal Swab     Status: None   Collection Time: 01/29/22  1:54 PM   Specimen: Anterior Nasal Swab  Result Value Ref Range Status   SARS Coronavirus 2 by RT PCR NEGATIVE NEGATIVE Final    Comment: (NOTE) SARS-CoV-2 target nucleic acids are NOT DETECTED.  The SARS-CoV-2 RNA is generally detectable in upper respiratory specimens during the acute phase of infection. The lowest concentration of SARS-CoV-2 viral copies this assay can detect is 138 copies/mL. A negative result does not preclude SARS-Cov-2 infection and should not be used as the sole basis for treatment or other patient management decisions. A negative result may occur with  improper specimen collection/handling, submission of specimen other than nasopharyngeal swab, presence of viral mutation(s) within the areas targeted by this assay, and inadequate number of viral copies(<138 copies/mL). A negative result must be combined with clinical observations, patient history, and epidemiological information. The expected result is Negative.  Fact Sheet for Patients:  BloggerCourse.com  Fact Sheet for Healthcare Providers:  SeriousBroker.it  This test is no t yet approved or cleared by the [place] FDA and  has been authorized for detection and/or diagnosis of SARS-CoV-2 by FDA under an  Emergency Use Authorization (EUA). This EUA will remain  in effect (meaning this test can be used) for the duration of the COVID-19 declaration under Section 564(b)(1) of the Act, 21 U.S.C.section 360bbb-3(b)(1), unless the authorization is terminated  or revoked sooner.       Influenza A by PCR NEGATIVE NEGATIVE Final   Influenza B by PCR NEGATIVE NEGATIVE Final    Comment: (NOTE) The Xpert Xpress SARS-CoV-2/FLU/RSV plus assay is intended as an aid in the diagnosis of influenza from Nasopharyngeal swab specimens and should not be used as a sole basis for treatment. Nasal washings and aspirates are unacceptable for Xpert Xpress SARS-CoV-2/FLU/RSV testing.  Fact Sheet for Patients: BloggerCourse.com  Fact Sheet for Healthcare Providers: SeriousBroker.it  This test is not yet approved or cleared by the [place] FDA and has been authorized for detection and/or diagnosis of SARS-CoV-2 by FDA under an Emergency Use Authorization (EUA). This EUA will remain in effect (meaning this test can be used) for the duration of the COVID-19 declaration under Section 564(b)(1) of the Act, 21 U.S.C. section 360bbb-3(b)(1), unless the authorization is terminated or revoked.     Resp Syncytial Virus by PCR NEGATIVE NEGATIVE Final    Comment: (NOTE) Fact Sheet for Patients: BloggerCourse.com  Fact Sheet for Healthcare Providers: SeriousBroker.it  This test is not yet approved or cleared by the [place] FDA and has been authorized for detection and/or diagnosis of SARS-CoV-2 by FDA under an Emergency Use Authorization (EUA). This EUA will remain in effect (meaning this test can be used) for the duration of the COVID-19 declaration under Section 564(b)(1) of the Act, 21 U.S.C. section 360bbb-3(b)(1), unless the authorization is terminated or revoked.  Performed at [facility], [address] [place], [place] [address]   MRSA Next Gen by PCR, Nasal     Status: None  Collection Time: 01/29/22  7:32 PM   Specimen: Nasal Mucosa; Nasal Swab  Result Value Ref Range Status   MRSA by PCR Next Gen NOT DETECTED NOT DETECTED Final    Comment: (NOTE) The GeneXpert MRSA Assay (FDA approved for NASAL specimens only), is one component of a comprehensive MRSA colonization surveillance program. It is not intended to diagnose MRSA infection nor to guide or monitor treatment for MRSA infections. Test performance is not FDA approved in patients less than 63 years old. Performed at [facility], [address] [place], [place] [address]   Urine Culture     Status: Abnormal   Collection Time: 01/31/22 11:44 AM   Specimen: Urine, Clean Catch  Result Value Ref Range Status   Specimen Description   Final    URINE, CLEAN CATCH Performed at [facility], [address] [place], [place] [address]    Special Requests   Final    NONE Performed at [facility], [address], [place], [place] [address]    Culture 20,000 COLONIES/mL ESCHERICHIA COLI (A)  Final   Report Status 02/02/2022 FINAL  Final   Organism ID, Bacteria ESCHERICHIA COLI (A)  Final      Susceptibility   Escherichia coli - MIC*    AMPICILLIN 4 SENSITIVE Sensitive     CEFAZOLIN <=4 SENSITIVE Sensitive     CEFEPIME <=0.12 SENSITIVE Sensitive     CEFTRIAXONE <=0.25 SENSITIVE Sensitive     CIPROFLOXACIN <=0.25 SENSITIVE Sensitive     GENTAMICIN <=1 SENSITIVE Sensitive     IMIPENEM <=0.25 SENSITIVE Sensitive     NITROFURANTOIN <=16 SENSITIVE Sensitive     TRIMETH/SULFA <=20 SENSITIVE Sensitive     AMPICILLIN/SULBACTAM <=2 SENSITIVE Sensitive     PIP/TAZO <=4 SENSITIVE Sensitive     * 20,000 COLONIES/mL ESCHERICHIA COLI     Time coordinating discharge: Over 30 minutes  SIGNED:   Alvira Philips [place], DO  [facility] 02/03/2022,  10:30 AM

## 2022-02-03 NOTE — Progress Notes (Signed)
Patient being transferred to [facility]. Report called to [facility] [employer] at [phone]. PIV removed. Family at bedside and aware of transfer. Awaiting [facility] transport. Report given to oncoming night shift RN.

## 2022-03-01 DEATH — deceased

## 2022-11-24 IMAGING — MR MR HEAD W/O CM
12 of 13 series · 44 of 48 positions shown · non-contrast
Comparison: Prior study from 03/14/2020.

CLINICAL DATA: Initial evaluation for acute neuro deficit, stroke
suspected.

EXAM:
MRI HEAD WITHOUT CONTRAST
TECHNIQUE: Multiplanar, multiecho pulse sequences of the brain and surrounding
structures were obtained without intravenous contrast.

[Series 9: DWI · axial · 3.0mm · 0.88mm/px · z∈[+19,+164]mm · 8 of 100 slices shown (1 of 4)]
[im 1/100]
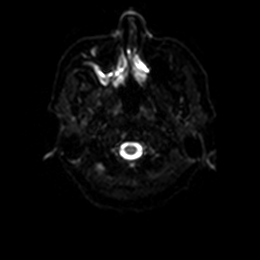
[im 15/100]
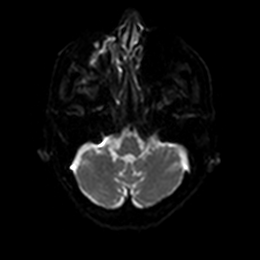
[im 29/100]
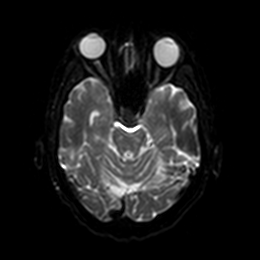
[im 43/100]
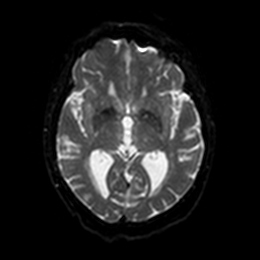
[im 57/100]
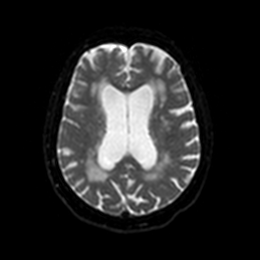
[im 71/100]
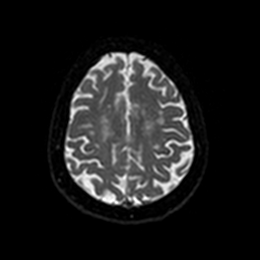
[im 85/100]
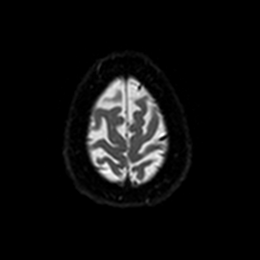
[im 100/100]
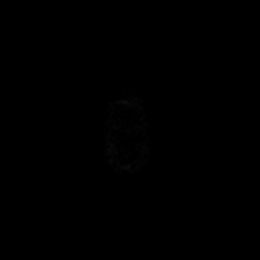

[Series 10: DWI · axial · 3.0mm · 0.88mm/px · z∈[+19,+164]mm · 4 of 50 slices shown (2 of 4)]
[im 1/50]
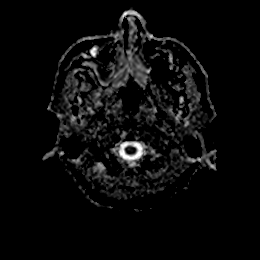
[im 17/50]
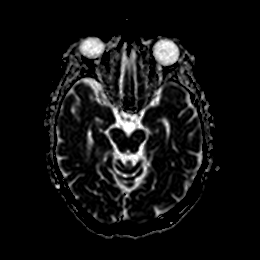
[im 33/50]
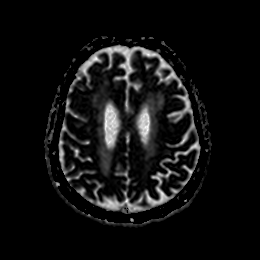
[im 50/50]
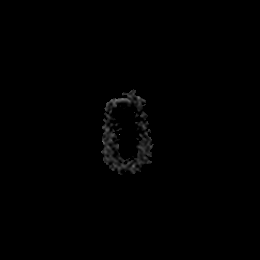

[Series 11: DWI · coronal · 4.0mm · 0.88mm/px · 6 of 74 slices shown (3 of 4)]
[im 1/74]
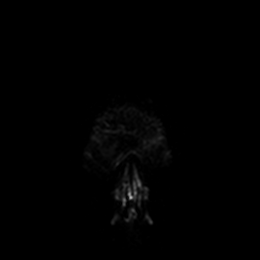
[im 15/74]
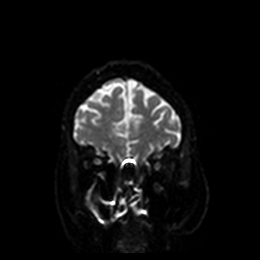
[im 30/74]
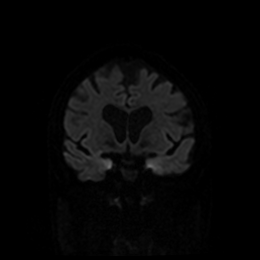
[im 44/74]
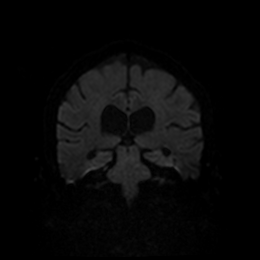
[im 59/74]
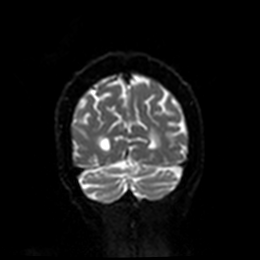
[im 74/74]
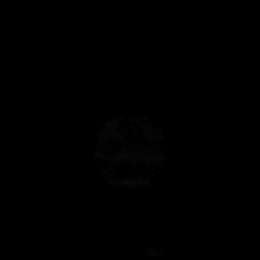

[Series 12: DWI · coronal · 4.0mm · 0.88mm/px · 3 of 37 slices shown (4 of 4)]
[im 1/37]
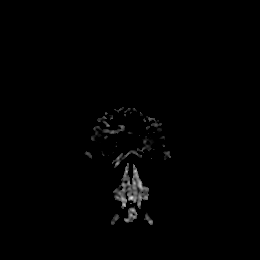
[im 19/37]
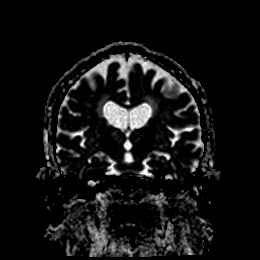
[im 37/37]
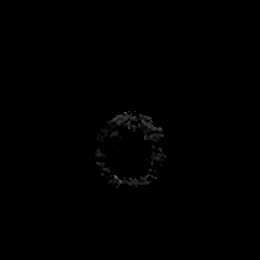

[Series 13: FLAIR · axial · 5.0mm · 0.90mm/px · z∈[+14,+169]mm · 2 of 27 slices shown]
[im 1/27]
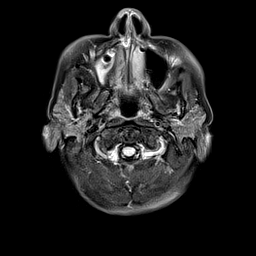
[im 27/27]
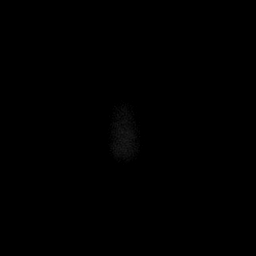

[Series 14: T2 · axial · 5.0mm · 0.72mm/px · z∈[+14,+169]mm · 2 of 27 slices shown (1 of 2)]
[im 1/27]
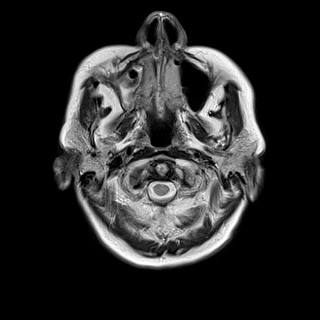
[im 27/27]
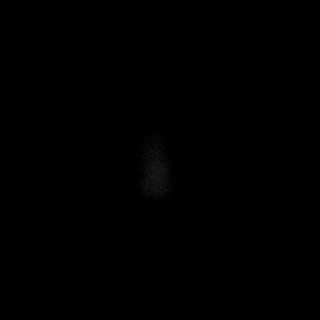

[Series 15: mag_images · axial · 3.0mm · 0.90mm/px · z∈[+15,+167]mm · 4 of 52 slices shown]
[im 1/52]
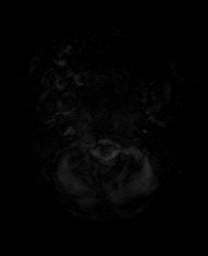
[im 18/52]
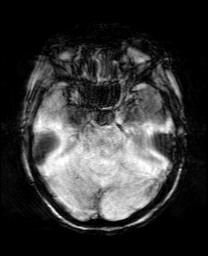
[im 35/52]
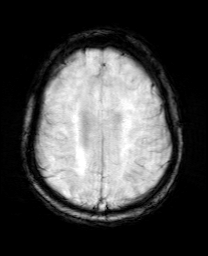
[im 52/52]
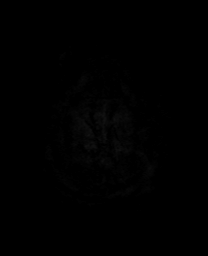

[Series 16: pha_images · axial · 3.0mm · 0.90mm/px · z∈[+15,+167]mm · 4 of 52 slices shown]
[im 1/52]
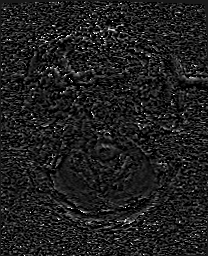
[im 18/52]
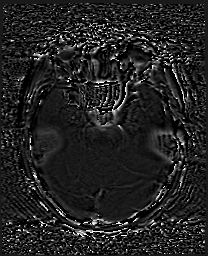
[im 35/52]
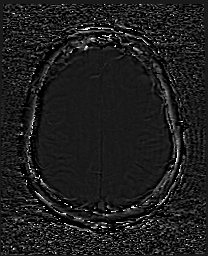
[im 52/52]
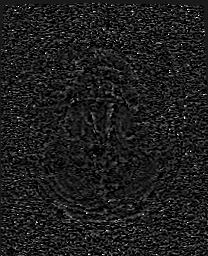

[Series 17: swi_images · axial · 3.0mm · 0.90mm/px · z∈[+15,+167]mm · 4 of 52 slices shown]
[im 1/52]
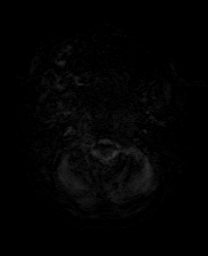
[im 18/52]
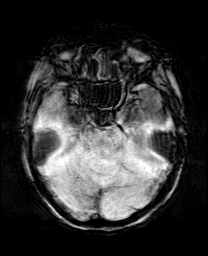
[im 35/52]
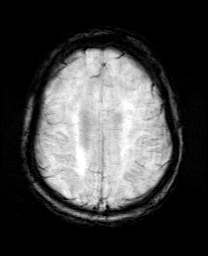
[im 52/52]
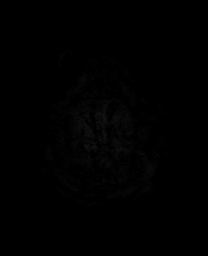

[Series 18: mip_images(sw) · axial · 24.0mm · 0.90mm/px · z∈[+26,+157]mm · 3 of 45 slices shown]
[im 1/45]
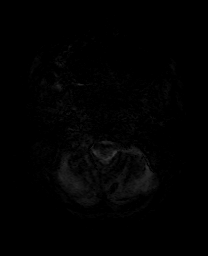
[im 23/45]
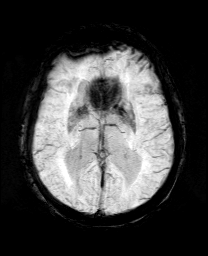
[im 45/45]
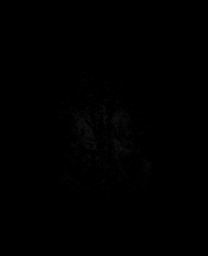

[Series 20: T2 · coronal · 5.0mm · 0.34mm/px · 2 of 30 slices shown (2 of 2)]
[im 1/30]
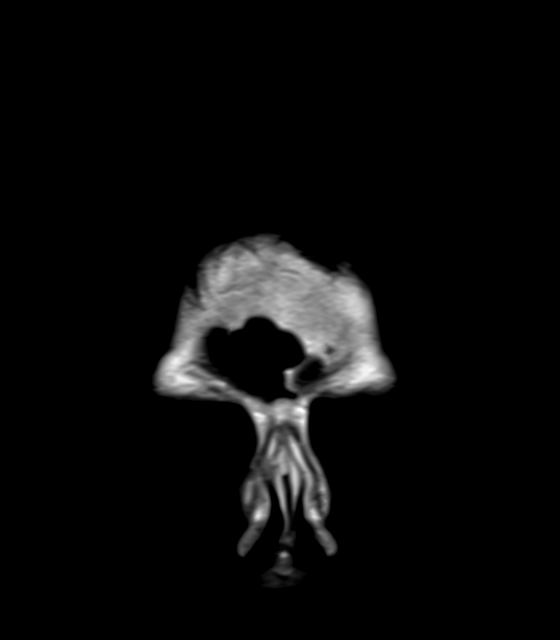
[im 30/30]
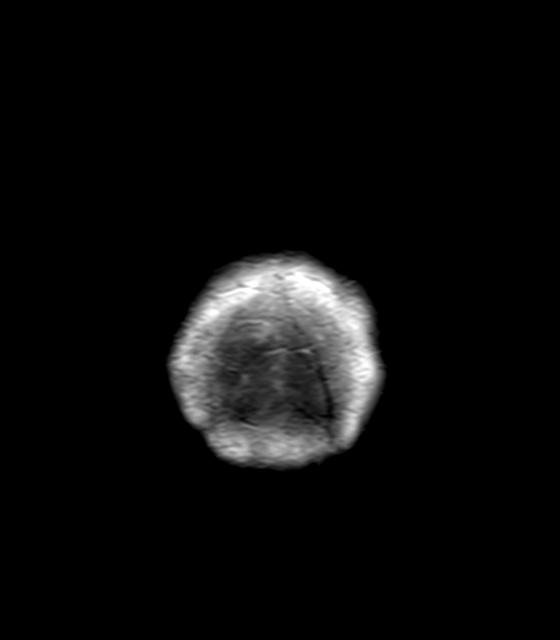

[Series 21: T1 · sagittal · 5.0mm · 0.75mm/px · 2 of 24 slices shown]
[im 1/24]
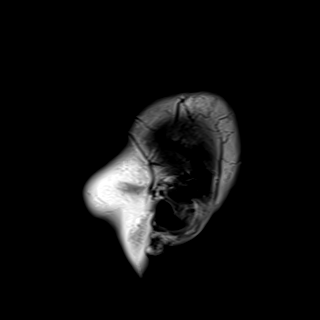
[im 24/24]
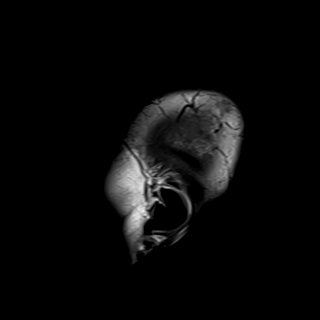

[44 of 48 positions shown; findings below may reference images not displayed]

FINDINGS: Brain: Generalized age-related cerebral atrophy. Patchy and
confluent T2/FLAIR hyperintensity involving the periventricular and
deep white matter both cerebral hemispheres as well as the pons,
most consistent with chronic small vessel ischemic disease,
moderately advanced in nature.

No evidence for acute or subacute ischemia. Gray-white matter
differentiation maintained. No areas of chronic cortical infarction.
No acute or chronic intracranial blood products.

No mass lesion, midline shift or mass effect no hydrocephalus or
extra-axial fluid collection. Pituitary gland suprasellar region
normal.

Vascular: Major intracranial vascular flow voids are maintained.

Skull and upper cervical spine: Craniocervical junction normal. Bone
marrow signal intensity within normal limits. No scalp soft tissue
abnormality.

Sinuses/Orbits: Prior bilateral ocular lens replacement. Moderate
right maxillary sinusitis noted. Additional scattered mucosal
thickening noted within the ethmoidal air cells. No mastoid
effusion.

Other: None.
IMPRESSION: 1. No acute intracranial abnormality.
2. Generalized age-related cerebral atrophy with moderately advanced
chronic microvascular ischemic disease.
3. Chronic right maxillary sinusitis.
